# Patient Record
Sex: Female | Born: 1950 | Race: White | Hispanic: No | Marital: Married | Smoking: Current every day smoker
Health system: Southern US, Community
[De-identification: ages and names within clinical notes are randomized; demographics above are authoritative.]

## PROBLEM LIST (undated history)

## (undated) DIAGNOSIS — E785 Hyperlipidemia, unspecified: Secondary | ICD-10-CM

## (undated) HISTORY — DX: Hyperlipidemia, unspecified: E78.5

---
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
## 2012-11-26 ENCOUNTER — Encounter: Payer: Self-pay | Admitting: Physician Assistant

## 2012-11-26 ENCOUNTER — Ambulatory Visit (INDEPENDENT_AMBULATORY_CARE_PROVIDER_SITE_OTHER): Payer: PRIVATE HEALTH INSURANCE | Admitting: Physician Assistant

## 2012-11-26 ENCOUNTER — Other Ambulatory Visit: Payer: Self-pay | Admitting: Physician Assistant

## 2012-11-26 VITALS — BP 123/75 | HR 78 | Ht 61.0 in | Wt 142.0 lb

## 2012-11-26 DIAGNOSIS — E785 Hyperlipidemia, unspecified: Secondary | ICD-10-CM | POA: Insufficient documentation

## 2012-11-26 DIAGNOSIS — Z131 Encounter for screening for diabetes mellitus: Secondary | ICD-10-CM

## 2012-11-26 DIAGNOSIS — Z0000 Encounter for general adult medical examination without abnormal findings: Secondary | ICD-10-CM

## 2012-11-26 LAB — LIPID PANEL: Cholesterol: 198 mg/dL (ref 0–200)

## 2012-11-26 LAB — COMPLETE METABOLIC PANEL WITH GFR
Albumin: 4.8 g/dL (ref 3.5–5.2)
BUN: 13 mg/dL (ref 6–23)
CO2: 29 mEq/L (ref 19–32)
Calcium: 9.6 mg/dL (ref 8.4–10.5)
Chloride: 105 mEq/L (ref 96–112)
GFR, Est Non African American: 79 mL/min
Glucose, Bld: 98 mg/dL (ref 70–99)
Potassium: 4.1 mEq/L (ref 3.5–5.3)

## 2012-11-26 MED ORDER — ATORVASTATIN CALCIUM 10 MG PO TABS: 10.0000 mg | ORAL_TABLET | Freq: Every day | ORAL | Status: DC

## 2012-11-26 NOTE — Patient Instructions (Addendum)
Will call with lab results.  Continue with regular exercise.

## 2012-11-26 NOTE — Progress Notes (Addendum)
  Subjective:     Nashea Chumney is a 62 y.o. female and is here for a comprehensive physical exam. The patient reports no problems.  History   Social History  . Marital Status: Married    Spouse Name: N/A    Number of Children: N/A  . Years of Education: N/A   Occupational History  . Not on file.   Social History Main Topics  . Smoking status: Current Every Day Smoker  . Smokeless tobacco: Not on file  . Alcohol Use: 3.0 oz/week    5 Glasses of wine per week  . Drug Use: No  . Sexually Active: Yes   Other Topics Concern  . Not on file   Social History Narrative  . No narrative on file   Health Maintenance  Topic Date Due  . Influenza Vaccine  05/07/1952  . Pap Smear  07/06/1968  . Tetanus/tdap  07/06/1969  . Colonoscopy  07/06/2000  . Zostavax  07/06/2010  . Mammogram  04/05/2013    The following portions of the patient's history were reviewed and updated as appropriate: allergies, current medications, past family history, past medical history, past social history, past surgical history and problem list.  Review of Systems A comprehensive review of systems was negative.   Objective:    BP 123/75  Pulse 78  Ht 5\' 1"  (1.549 m)  Wt 142 lb (64.411 kg)  BMI 26.84 kg/m2 General appearance: alert, cooperative and appears stated age Head: Normocephalic, without obvious abnormality, atraumatic Eyes: conjunctivae/corneas clear. PERRL, EOM's intact. Fundi benign. Ears: normal TM's and external ear canals both ears Nose: Nares normal. Septum midline. Mucosa normal. No drainage or sinus tenderness. Throat: lips, mucosa, and tongue normal; teeth and gums normal Neck: no adenopathy, no carotid bruit, no JVD, supple, symmetrical, trachea midline and thyroid not enlarged, symmetric, no tenderness/mass/nodules Back: symmetric, no curvature. ROM normal. No CVA tenderness. Lungs: clear to auscultation bilaterally Heart: regular rate and rhythm, S1, S2 normal, no murmur, click,  rub or gallop Abdomen: soft, non-tender; bowel sounds normal; no masses,  no organomegaly no umbilicus from birth herniation and was filled in.  Extremities: extremities normal, atraumatic, no cyanosis or edema Pulses: 2+ and symmetric Skin: Skin color, texture, turgor normal. No rashes or lesions Lymph nodes: Cervical, supraclavicular, and axillary nodes normal. Neurologic: Grossly normal    Assessment:    Healthy female exam.      Plan:    CPE/hyperlipidemia- Will get fasting labs. Vaccines up to date. Per pt reports Tdap 3 years ago and shingles. Will wait for record confirmation. Pap smear up to date. Continue with daily exercise and healthy diet. Will order mammogram. Per pt last Bone density was 5 years ago and great. Consumes 4-5 servings of dairy a day.  See After Visit Summary for Counseling Recommendations

## 2012-11-28 ENCOUNTER — Encounter: Payer: Self-pay | Admitting: Physician Assistant

## 2012-11-28 ENCOUNTER — Telehealth: Payer: Self-pay | Admitting: *Deleted

## 2012-11-28 DIAGNOSIS — Z1239 Encounter for other screening for malignant neoplasm of breast: Secondary | ICD-10-CM

## 2012-11-28 NOTE — Telephone Encounter (Signed)
Mammogram ordered

## 2012-12-11 ENCOUNTER — Ambulatory Visit (INDEPENDENT_AMBULATORY_CARE_PROVIDER_SITE_OTHER): Payer: PRIVATE HEALTH INSURANCE

## 2012-12-11 DIAGNOSIS — Z1239 Encounter for other screening for malignant neoplasm of breast: Secondary | ICD-10-CM

## 2012-12-11 DIAGNOSIS — Z1231 Encounter for screening mammogram for malignant neoplasm of breast: Secondary | ICD-10-CM

## 2013-01-30 ENCOUNTER — Encounter: Payer: Self-pay | Admitting: Physician Assistant

## 2013-01-30 MED ORDER — ATORVASTATIN CALCIUM 10 MG PO TABS: 10.0000 mg | ORAL_TABLET | Freq: Every day | ORAL | Status: DC

## 2013-04-18 ENCOUNTER — Telehealth: Payer: Self-pay | Admitting: Family Medicine

## 2013-04-18 DIAGNOSIS — Z23 Encounter for immunization: Secondary | ICD-10-CM

## 2013-04-18 MED ORDER — ZOSTER VACCINE LIVE 19400 UNT/0.65ML ~~LOC~~ SOLR: 0.6500 mL | Freq: Once | SUBCUTANEOUS | Status: DC

## 2013-04-18 NOTE — Telephone Encounter (Signed)
Need for shingles vaccine

## 2013-07-11 ENCOUNTER — Other Ambulatory Visit: Payer: Self-pay

## 2013-11-27 ENCOUNTER — Ambulatory Visit (INDEPENDENT_AMBULATORY_CARE_PROVIDER_SITE_OTHER): Payer: BC Managed Care – PPO | Admitting: Physician Assistant

## 2013-11-27 ENCOUNTER — Other Ambulatory Visit: Payer: Self-pay | Admitting: Physician Assistant

## 2013-11-27 ENCOUNTER — Other Ambulatory Visit (HOSPITAL_COMMUNITY)
Admission: RE | Admit: 2013-11-27 | Discharge: 2013-11-27 | Disposition: A | Discharge status: Home / Self Care | Payer: BC Managed Care – PPO | Source: Ambulatory Visit | Attending: Family Medicine | Admitting: Family Medicine

## 2013-11-27 ENCOUNTER — Encounter: Payer: Self-pay | Admitting: Physician Assistant

## 2013-11-27 VITALS — BP 117/71 | HR 81 | Ht 61.0 in | Wt 139.0 lb

## 2013-11-27 DIAGNOSIS — Z1151 Encounter for screening for human papillomavirus (HPV): Secondary | ICD-10-CM | POA: Insufficient documentation

## 2013-11-27 DIAGNOSIS — Z131 Encounter for screening for diabetes mellitus: Secondary | ICD-10-CM

## 2013-11-27 DIAGNOSIS — F17200 Nicotine dependence, unspecified, uncomplicated: Secondary | ICD-10-CM

## 2013-11-27 DIAGNOSIS — Z01419 Encounter for gynecological examination (general) (routine) without abnormal findings: Secondary | ICD-10-CM

## 2013-11-27 DIAGNOSIS — Z0000 Encounter for general adult medical examination without abnormal findings: Secondary | ICD-10-CM

## 2013-11-27 DIAGNOSIS — Z1231 Encounter for screening mammogram for malignant neoplasm of breast: Secondary | ICD-10-CM

## 2013-11-27 DIAGNOSIS — Z780 Asymptomatic menopausal state: Secondary | ICD-10-CM

## 2013-11-27 DIAGNOSIS — E785 Hyperlipidemia, unspecified: Secondary | ICD-10-CM

## 2013-11-27 DIAGNOSIS — Z23 Encounter for immunization: Secondary | ICD-10-CM

## 2013-11-27 LAB — COMPLETE METABOLIC PANEL WITH GFR
ALBUMIN: 4.4 g/dL (ref 3.5–5.2)
ALT: 13 U/L (ref 0–35)
AST: 16 U/L (ref 0–37)
Alkaline Phosphatase: 49 U/L (ref 39–117)
BUN: 10 mg/dL (ref 6–23)
CALCIUM: 9.1 mg/dL (ref 8.4–10.5)
CO2: 28 meq/L (ref 19–32)
Chloride: 106 mEq/L (ref 96–112)
Creat: 0.68 mg/dL (ref 0.50–1.10)
GLUCOSE: 88 mg/dL (ref 70–99)
POTASSIUM: 4.2 meq/L (ref 3.5–5.3)
SODIUM: 141 meq/L (ref 135–145)
TOTAL PROTEIN: 6.5 g/dL (ref 6.0–8.3)
Total Bilirubin: 0.5 mg/dL (ref 0.2–1.2)

## 2013-11-27 LAB — LIPID PANEL
Cholesterol: 170 mg/dL (ref 0–200)
HDL: 59 mg/dL (ref >39)
LDL Cholesterol: 92 mg/dL (ref 0–99)
Total CHOL/HDL Ratio: 2.9 Ratio
Triglycerides: 95 mg/dL (ref <150)
VLDL: 19 mg/dL (ref 0–40)

## 2013-11-27 NOTE — Progress Notes (Signed)
  Subjective:     Leslie Oconnell is a 63 y.o. female and is here for a comprehensive physical exam. The patient reports no problems.  History   Social History  . Marital Status: Married    Spouse Name: N/A    Number of Children: N/A  . Years of Education: N/A   Occupational History  . Not on file.   Social History Main Topics  . Smoking status: Current Every Day Smoker  . Smokeless tobacco: Not on file  . Alcohol Use: 3.0 oz/week    5 Glasses of wine per week  . Drug Use: No  . Sexual Activity: Yes   Other Topics Concern  . Not on file   Social History Narrative  . No narrative on file   Health Maintenance  Topic Date Due  . Influenza Vaccine  05/30/2014 (Originally 04/05/2013)  . Colonoscopy  09/05/2014  . Mammogram  12/12/2014  . Pap Smear  11/27/2016  . Tetanus/tdap  11/28/2023  . Zostavax  Completed    The following portions of the patient's history were reviewed and updated as appropriate: allergies, current medications, past family history, past medical history, past social history, past surgical history and problem list.  Review of Systems A comprehensive review of systems was negative.   Objective:    BP 117/71  Pulse 81  Ht 5\' 1"  (1.549 m)  Wt 139 lb (63.05 kg)  BMI 26.28 kg/m2 General appearance: alert, cooperative and appears stated age Head: Normocephalic, without obvious abnormality, atraumatic Eyes: conjunctivae/corneas clear. PERRL, EOM's intact. Fundi benign. Ears: normal TM's and external ear canals both ears Nose: Nares normal. Septum midline. Mucosa normal. No drainage or sinus tenderness. Throat: lips, mucosa, and tongue normal; teeth and gums normal Neck: no adenopathy, no carotid bruit, no JVD, supple, symmetrical, trachea midline and thyroid not enlarged, symmetric, no tenderness/mass/nodules Back: symmetric, no curvature. ROM normal. No CVA tenderness. Lungs: clear to auscultation bilaterally Heart: regular rate and rhythm, S1, S2 normal,  no murmur, click, rub or gallop Abdomen: soft, non-tender; bowel sounds normal; no masses,  no organomegaly Pelvic: cervix normal in appearance, external genitalia normal, no adnexal masses or tenderness, no cervical motion tenderness, uterus normal size, shape, and consistency and vagina normal without discharge Extremities: extremities normal, atraumatic, no cyanosis or edema Pulses: 2+ and symmetric Skin: Skin color, texture, turgor normal. No rashes or lesions Lymph nodes: Cervical, supraclavicular, and axillary nodes normal. Neurologic: Grossly normal    Assessment:    Healthy female exam.      Plan:    CPE- Tdap given today. Pap done today. Will order bone density to have completed with mammogram. Last colonoscopy was 2006 supposed to follow up in 10 years. She will be due for colonoscopy next year. Depression screening was zero out of two. Discussed pneumonia vaccine. Patient would like to check with insurance company first. Smoking cessation counseling was given to patient today. She continues to smoke 5-6 cigarettes a day. She continues to walk at least 5 miles a day. She denies any problems with shortness of breath or wheezing.  Screening labs given for patient to get drawn today. Patient does take Lipitor daily as well as calcium and vitamin D. See After Visit Summary for Counseling Recommendations

## 2013-11-27 NOTE — Patient Instructions (Signed)

## 2013-11-29 ENCOUNTER — Other Ambulatory Visit: Payer: Self-pay | Admitting: Physician Assistant

## 2013-11-29 MED ORDER — ATORVASTATIN CALCIUM 10 MG PO TABS: 10.0000 mg | ORAL_TABLET | Freq: Every day | ORAL | Status: DC

## 2013-12-24 ENCOUNTER — Ambulatory Visit (INDEPENDENT_AMBULATORY_CARE_PROVIDER_SITE_OTHER): Payer: BC Managed Care – PPO

## 2013-12-24 ENCOUNTER — Ambulatory Visit: Payer: BC Managed Care – PPO

## 2013-12-24 DIAGNOSIS — M899 Disorder of bone, unspecified: Secondary | ICD-10-CM

## 2013-12-24 DIAGNOSIS — M949 Disorder of cartilage, unspecified: Secondary | ICD-10-CM

## 2013-12-24 DIAGNOSIS — Z1231 Encounter for screening mammogram for malignant neoplasm of breast: Secondary | ICD-10-CM

## 2013-12-25 ENCOUNTER — Encounter: Payer: Self-pay | Admitting: Physician Assistant

## 2013-12-25 DIAGNOSIS — M8580 Other specified disorders of bone density and structure, unspecified site: Secondary | ICD-10-CM | POA: Insufficient documentation

## 2015-04-01 ENCOUNTER — Other Ambulatory Visit: Payer: Self-pay | Admitting: Physician Assistant

## 2015-04-01 DIAGNOSIS — Z139 Encounter for screening, unspecified: Secondary | ICD-10-CM

## 2015-04-09 ENCOUNTER — Ambulatory Visit (INDEPENDENT_AMBULATORY_CARE_PROVIDER_SITE_OTHER): Payer: BLUE CROSS/BLUE SHIELD

## 2015-04-09 DIAGNOSIS — Z139 Encounter for screening, unspecified: Secondary | ICD-10-CM

## 2015-04-09 DIAGNOSIS — Z1231 Encounter for screening mammogram for malignant neoplasm of breast: Secondary | ICD-10-CM | POA: Diagnosis not present

## 2015-04-18 ENCOUNTER — Emergency Department (INDEPENDENT_AMBULATORY_CARE_PROVIDER_SITE_OTHER)
Admission: EM | Admit: 2015-04-18 | Discharge: 2015-04-18 | Disposition: A | Discharge status: Home / Self Care | Payer: BLUE CROSS/BLUE SHIELD | Source: Home / Self Care | Attending: Family Medicine | Admitting: Family Medicine

## 2015-04-18 ENCOUNTER — Encounter: Payer: Self-pay | Admitting: Emergency Medicine

## 2015-04-18 DIAGNOSIS — J019 Acute sinusitis, unspecified: Secondary | ICD-10-CM | POA: Diagnosis not present

## 2015-04-18 DIAGNOSIS — B9689 Other specified bacterial agents as the cause of diseases classified elsewhere: Secondary | ICD-10-CM

## 2015-04-18 LAB — POCT RAPID STREP A (OFFICE): Rapid Strep A Screen: NEGATIVE

## 2015-04-18 MED ORDER — SALINE SPRAY 0.65 % NA SOLN: 1.0000 | NASAL | Status: AC | PRN

## 2015-04-18 MED ORDER — BENZONATATE 100 MG PO CAPS: 100.0000 mg | ORAL_CAPSULE | Freq: Three times a day (TID) | ORAL | Status: DC

## 2015-04-18 MED ORDER — DOXYCYCLINE HYCLATE 100 MG PO CAPS: 100.0000 mg | ORAL_CAPSULE | Freq: Two times a day (BID) | ORAL | Status: DC

## 2015-04-18 NOTE — Discharge Instructions (Signed)
Please take your antibiotic, Doxycycline, as prescribed and be sure to complete entire course even if you start to feel better to ensure infection does not come back.   You may take 400-600mg  Ibuprofen (Motrin) every 6-8 hours for fever and pain  Alternate with Tylenol  You may take 500mg  Tylenol every 4-6 hours as needed for fever and pain  Follow-up with your primary care provider next week for recheck of symptoms if not improving.  Be sure to drink plenty of fluids and rest, at least 8hrs of sleep a night, preferably more while you are sick. Return urgent care or go to closest ER if you cannot keep down fluids/signs of dehydration, fever not reducing with Tylenol, difficulty breathing/wheezing, stiff neck, worsening condition, or other concerns (see below)

## 2015-04-18 NOTE — ED Provider Notes (Signed)
CSN: [id number]     Arrival date & time 04/18/15  7096 History   First MD Initiated Contact with Patient 04/18/15 [phone]     Chief Complaint  Patient presents with  . Sinus Problem  . Nasal Congestion  . Cough   (Consider location/radiation/quality/duration/timing/severity/associated sxs/prior Treatment) HPI The pt is a 64yo female presenting to [facility] with c/o gradually worsening cough with nasal congestion, sore throat, and bilateral ear pain and pressure. Pt states she was around her grandchildren recently who were diagnosed with strep throat. Pt states she has never had strep throat but would like to be checked as her symptoms are worsening. Pt's main complaint is her nasal congestion and bilateral ear "fullness" that is moderate in severity.  Cough is mild intermittent, non-productive. She has not tried any OTC medications as she states she has had a decongestant in the past and "couldn't sleep for a whole week."  She reports subjective fever with hot and cold chills but her home thermometer read 96 F.  Denies n/v/d. Denies tick bites or rashes. No recent travel but states she is traveling to [place] in about 3 weeks and wants to be better by then.  Annual Physical with her PCP, [place] Planas is scheduled for 05/06/15.  Past Medical History  Diagnosis Date  . Hyperlipidemia    Past Surgical History  Procedure Laterality Date  . Hernia repair  1952  . Tonsilectomy/adenoidectomy with myringotomy  1964   Family History  Problem Relation Age of Onset  . Cancer Mother    Social History  Substance Use Topics  . Smoking status: Current Every Day Smoker  . Smokeless tobacco: None  . Alcohol Use: 3.0 oz/week    5 Glasses of wine per week   OB History    No data available     Review of Systems  Constitutional: Positive for fever ( subjective) and chills.  HENT: Positive for congestion, ear pain ( bilateral) and sore throat. Negative for trouble swallowing and voice change.    Respiratory: Positive for cough and wheezing. Negative for shortness of breath.   Cardiovascular: Negative for chest pain and palpitations.  Gastrointestinal: Negative for nausea, vomiting, abdominal pain and diarrhea.  Musculoskeletal: Positive for myalgias and arthralgias. Negative for back pain.  Skin: Negative for rash.  All other systems reviewed and are negative.   Allergies  Review of patient's allergies indicates no known allergies.  Home Medications   Prior to Admission medications   Medication Sig Start Date End Date Taking? Authorizing Provider  aspirin 81 MG tablet Take 81 mg by mouth daily.    Historical Provider, MD  atorvastatin (LIPITOR) 10 MG tablet Take 1 tablet (10 mg total) by mouth daily. 11/29/13   Jade L Breeback, PA-C  benzonatate (TESSALON) 100 MG capsule Take 1 capsule (100 mg total) by mouth every 8 (eight) hours. 04/18/15   Noland Fordyce, PA-C  calcium carbonate (OS-CAL) 600 MG TABS Take 600 mg by mouth daily.    Historical Provider, MD  CINNAMON PO Take 2,000 mg by mouth daily.    Historical Provider, MD  doxycycline (VIBRAMYCIN) 100 MG capsule Take 1 capsule (100 mg total) by mouth 2 (two) times daily. One po bid x 7 days 04/18/15   Noland Fordyce, PA-C  fish oil-omega-3 fatty acids 1000 MG capsule Take 2 g by mouth daily.    Historical Provider, MD  Red Yeast Rice 600 MG CAPS Take 1,200 mg by mouth daily.    Historical Provider, MD  sodium chloride (OCEAN) 0.65 % SOLN nasal spray Place 1 spray into both nostrils as needed. 04/18/15   Noland Fordyce, PA-C  zoster vaccine live, PF, (ZOSTAVAX) 48546 UNT/0.65ML injection Inject 19,400 Units into the skin once. 04/18/13   Sean Hommel, DO   BP 133/73 mmHg  Pulse 86  Temp(Src) 98.6 F (37 C) (Oral)  Resp 16  Ht 5\' 1"  (1.549 m)  Wt 137 lb 12 oz (62.483 kg)  BMI 26.04 kg/m2  SpO2 100% Physical Exam  Constitutional: She appears well-developed and well-nourished. No distress.  HENT:  Head: Normocephalic and  atraumatic.  Right Ear: Hearing, tympanic membrane, external ear and ear canal normal.  Left Ear: Hearing, tympanic membrane, external ear and ear canal normal.  Nose: Mucosal edema present. Right sinus exhibits no maxillary sinus tenderness and no frontal sinus tenderness. Left sinus exhibits no maxillary sinus tenderness and no frontal sinus tenderness.  Mouth/Throat: Uvula is midline and mucous membranes are normal. Posterior oropharyngeal erythema present. No oropharyngeal exudate, posterior oropharyngeal edema or tonsillar abscesses.  Eyes: Conjunctivae are normal. No scleral icterus.  Neck: Normal range of motion. Neck supple.  Cardiovascular: Normal rate, regular rhythm and normal heart sounds.   Pulmonary/Chest: Effort normal and breath sounds normal. No respiratory distress. She has no wheezes. She has no rales. She exhibits no tenderness.  Abdominal: Soft. There is no tenderness.  Musculoskeletal: Normal range of motion.  Neurological: She is alert.  Skin: Skin is warm and dry. She is not diaphoretic.  Nursing note and vitals reviewed.   ED Course  Procedures (including critical care time) Labs Review Labs Reviewed  POCT RAPID STREP A (OFFICE)    Imaging Review No results found.   MDM   1. Acute bacterial rhinosinusitis     Pt c/o worsening nasal congestion, ear pain, sore throat and cough. Exam c/w rhinosinusitis.  Due to duration of symptoms w/o improvement, will start on antibiotics for bacterial cause of symptoms. Rx: doxycycline and nasal saline. Tessalon pearls if cough worsens. Advised she may also take acetaminophen and/or ibuprofen for fever and pain. Encouraged fluids and rest. F/u with PCP in 1 week if not improving. Encouraged to keep previously scheduled annual physical exam for 8/31 Patient verbalized understanding and agreement with treatment plan.     Noland Fordyce, PA-C 04/18/15 1052

## 2015-04-18 NOTE — ED Notes (Signed)
Patient presents to [facility] with C/O cough cold and congestion times one week with intermittent sore throat.

## 2015-05-06 ENCOUNTER — Ambulatory Visit (INDEPENDENT_AMBULATORY_CARE_PROVIDER_SITE_OTHER): Payer: BLUE CROSS/BLUE SHIELD | Admitting: Physician Assistant

## 2015-05-06 ENCOUNTER — Encounter: Payer: Self-pay | Admitting: Physician Assistant

## 2015-05-06 VITALS — BP 125/58 | HR 84 | Ht 61.0 in | Wt 136.0 lb

## 2015-05-06 DIAGNOSIS — Z131 Encounter for screening for diabetes mellitus: Secondary | ICD-10-CM | POA: Diagnosis not present

## 2015-05-06 DIAGNOSIS — E785 Hyperlipidemia, unspecified: Secondary | ICD-10-CM | POA: Diagnosis not present

## 2015-05-06 DIAGNOSIS — Z0000 Encounter for general adult medical examination without abnormal findings: Secondary | ICD-10-CM

## 2015-05-06 DIAGNOSIS — Z23 Encounter for immunization: Secondary | ICD-10-CM

## 2015-05-06 DIAGNOSIS — Z114 Encounter for screening for human immunodeficiency virus [HIV]: Secondary | ICD-10-CM

## 2015-05-06 DIAGNOSIS — Z1159 Encounter for screening for other viral diseases: Secondary | ICD-10-CM

## 2015-05-06 DIAGNOSIS — Z1211 Encounter for screening for malignant neoplasm of colon: Secondary | ICD-10-CM

## 2015-05-06 LAB — COMPLETE METABOLIC PANEL WITH GFR
ALBUMIN: 4.7 g/dL (ref 3.6–5.1)
ALK PHOS: 44 U/L (ref 33–130)
ALT: 12 U/L (ref 6–29)
AST: 15 U/L (ref 10–35)
BILIRUBIN TOTAL: 0.5 mg/dL (ref 0.2–1.2)
BUN: 12 mg/dL (ref 7–25)
CO2: 28 mmol/L (ref 20–31)
CREATININE: 0.81 mg/dL (ref 0.50–0.99)
Calcium: 9.6 mg/dL (ref 8.6–10.4)
Chloride: 105 mmol/L (ref 98–110)
GFR, EST AFRICAN AMERICAN: 89 mL/min (ref >=60)
GFR, Est Non African American: 77 mL/min (ref >=60)
Glucose, Bld: 88 mg/dL (ref 65–99)
Potassium: 4.4 mmol/L (ref 3.5–5.3)
Sodium: 143 mmol/L (ref 135–146)
TOTAL PROTEIN: 6.7 g/dL (ref 6.1–8.1)

## 2015-05-06 LAB — LIPID PANEL
Cholesterol: 184 mg/dL (ref 125–200)
HDL: 50 mg/dL (ref >=46)
LDL Cholesterol: 109 mg/dL (ref <130)
Total CHOL/HDL Ratio: 3.7 Ratio (ref <=5.0)
Triglycerides: 125 mg/dL (ref <150)
VLDL: 25 mg/dL (ref <30)

## 2015-05-06 NOTE — Patient Instructions (Signed)

## 2015-05-07 LAB — HIV ANTIBODY (ROUTINE TESTING W REFLEX): HIV 1&2 Ab, 4th Generation: NONREACTIVE

## 2015-05-07 LAB — HEPATITIS C ANTIBODY: HCV Ab: NEGATIVE

## 2015-05-08 NOTE — Progress Notes (Signed)
  Subjective:     Leslie Oconnell is a 64 y.o. female and is here for a comprehensive physical exam. The patient reports no problems.  Social History   Social History  . Marital Status: Married    Spouse Name: N/A  . Number of Children: N/A  . Years of Education: N/A   Occupational History  . Not on file.   Social History Main Topics  . Smoking status: Current Every Day Smoker  . Smokeless tobacco: Not on file  . Alcohol Use: 3.0 oz/week    5 Glasses of wine per week  . Drug Use: No  . Sexual Activity: Yes   Other Topics Concern  . Not on file   Social History Narrative   Health Maintenance  Topic Date Due  . COLONOSCOPY  09/05/2014  . INFLUENZA VACCINE  04/05/2016  . PAP SMEAR  11/27/2016  . MAMMOGRAM  04/08/2017  . TETANUS/TDAP  11/28/2023  . ZOSTAVAX  Completed  . Hepatitis C Screening  Completed  . HIV Screening  Completed    The following portions of the patient's history were reviewed and updated as appropriate: allergies, current medications, past family history, past medical history, past social history, past surgical history and problem list.  Review of Systems A comprehensive review of systems was negative.   Objective:    BP 125/58 mmHg  Pulse 84  Ht 5\' 1"  (1.549 m)  Wt 136 lb (61.689 kg)  BMI 25.71 kg/m2 General appearance: alert, cooperative and appears stated age Head: Normocephalic, without obvious abnormality, atraumatic Eyes: conjunctivae/corneas clear. PERRL, EOM's intact. Fundi benign. Ears: normal TM's and external ear canals both ears Nose: Nares normal. Septum midline. Mucosa normal. No drainage or sinus tenderness. Throat: lips, mucosa, and tongue normal; teeth and gums normal Neck: no adenopathy, no carotid bruit, no JVD, supple, symmetrical, trachea midline and thyroid not enlarged, symmetric, no tenderness/mass/nodules Back: symmetric, no curvature. ROM normal. No CVA tenderness. Lungs: clear to auscultation bilaterally Heart: regular  rate and rhythm, S1, S2 normal, no murmur, click, rub or gallop Abdomen: soft, non-tender; bowel sounds normal; no masses,  no organomegaly Pelvic: cervix normal in appearance, external genitalia normal, no adnexal masses or tenderness, no cervical motion tenderness, uterus normal size, shape, and consistency and vagina normal without discharge Extremities: extremities normal, atraumatic, no cyanosis or edema Pulses: 2+ and symmetric Skin: Skin color, texture, turgor normal. No rashes or lesions Lymph nodes: Cervical, supraclavicular, and axillary nodes normal. Neurologic: Grossly normal    Assessment:    Healthy female exam.      Plan:    CPE- flu shot given.  mammogram up to date, pap up to date, fasting labs given. Screening labs done. Discussed vitamin D 800 units and calcium 1500mg  daily or 4 servings daily. Regular exercise. Colonoscopy referral given.    See After Visit Summary for Counseling Recommendations

## 2015-05-19 ENCOUNTER — Other Ambulatory Visit: Payer: Self-pay | Admitting: Physician Assistant

## 2015-09-09 DIAGNOSIS — Z1211 Encounter for screening for malignant neoplasm of colon: Secondary | ICD-10-CM | POA: Diagnosis not present

## 2015-09-09 DIAGNOSIS — K5521 Angiodysplasia of colon with hemorrhage: Secondary | ICD-10-CM | POA: Diagnosis not present

## 2016-04-12 ENCOUNTER — Emergency Department (INDEPENDENT_AMBULATORY_CARE_PROVIDER_SITE_OTHER)
Admission: EM | Admit: 2016-04-12 | Discharge: 2016-04-12 | Disposition: A | Discharge status: Home / Self Care | Payer: Medicare Other | Source: Home / Self Care | Attending: Family Medicine | Admitting: Family Medicine

## 2016-04-12 ENCOUNTER — Encounter: Payer: Self-pay | Admitting: *Deleted

## 2016-04-12 DIAGNOSIS — W57XXXA Bitten or stung by nonvenomous insect and other nonvenomous arthropods, initial encounter: Secondary | ICD-10-CM

## 2016-04-12 DIAGNOSIS — L089 Local infection of the skin and subcutaneous tissue, unspecified: Secondary | ICD-10-CM | POA: Diagnosis not present

## 2016-04-12 DIAGNOSIS — T148 Other injury of unspecified body region: Secondary | ICD-10-CM

## 2016-04-12 MED ORDER — CEPHALEXIN 500 MG PO CAPS: 500.0000 mg | ORAL_CAPSULE | Freq: Three times a day (TID) | ORAL | 0 refills | Status: AC

## 2016-04-12 NOTE — Discharge Instructions (Signed)
°  Please take antibiotics as prescribed and be sure to complete entire course even if you start to feel better to ensure infection does not come back.  You may also take benadryl to help with swelling and itching, or you may try a non-drowsy antihistamine such as Claritin or Zyrtec (generic form is fine)

## 2016-04-12 NOTE — ED Triage Notes (Signed)
Pt c/o LT hand redness and swelling x 2 days post bee sting. Denies fever.

## 2016-04-12 NOTE — ED Provider Notes (Signed)
CSN: [id number]     Arrival date & time 04/12/16  0813 History   First MD Initiated Contact with Patient 04/12/16 0825     Chief Complaint  Patient presents with  . Insect Bite   (Consider location/radiation/quality/duration/timing/severity/associated sxs/prior Treatment) HPI Leslie Oconnell is a 65 y.o. female presenting to UC with c/o gradually worsening Left hand swelling and redness that started yesterday after she was stung by a black insect pt believes may have been a hornet.  She has not tried anything besides ice for her symptoms. She is concerned the redness is now progressing into her wrist.  She reports minimal itching and soreness. Denies oral swelling or difficulty breathing. Denies rashes elsewhere.   Past Medical History:  Diagnosis Date  . Hyperlipidemia    Past Surgical History:  Procedure Laterality Date  . [place]  . TONSILECTOMY/ADENOIDECTOMY WITH MYRINGOTOMY  1964   Family History  Problem Relation Age of Onset  . Cancer Mother    Social History  Substance Use Topics  . Smoking status: Current Every Day Smoker    Packs/day: 0.50    Types: Cigarettes  . Smokeless tobacco: Never Used  . Alcohol use 3.0 oz/week    5 Glasses of wine per week   OB History    No data available     Review of Systems  Constitutional: Negative for chills and fever.  Respiratory: Negative for shortness of breath, wheezing and stridor.   Musculoskeletal: Positive for joint swelling. Negative for arthralgias and myalgias.  Skin: Positive for color change and rash. Negative for pallor and wound.    Allergies  Review of patient's allergies indicates no known allergies.  Home Medications   Prior to Admission medications   Medication Sig Start Date End Date Taking? Authorizing Provider  aspirin 81 MG tablet Take 81 mg by mouth daily.    Historical Provider, MD  atorvastatin (LIPITOR) 10 MG tablet TAKE 1 TABLET (10 MG TOTAL) BY MOUTH DAILY. 05/20/15   Jade L Breeback,  PA-C  calcium carbonate (OS-CAL) 600 MG TABS Take 600 mg by mouth daily.    Historical Provider, MD  cephALEXin (KEFLEX) 500 MG capsule Take 1 capsule (500 mg total) by mouth 3 (three) times daily. 04/12/16 04/19/16  Noland Fordyce, PA-C  CINNAMON PO Take 2,000 mg by mouth daily.    Historical Provider, MD  fish oil-omega-3 fatty acids 1000 MG capsule Take 2 g by mouth daily.    Historical Provider, MD  Red Yeast Rice 600 MG CAPS Take 1,200 mg by mouth daily.    Historical Provider, MD  sodium chloride (OCEAN) 0.65 % SOLN nasal spray Place 1 spray into both nostrils as needed. 04/18/15   Noland Fordyce, PA-C  zoster vaccine live, PF, (ZOSTAVAX) 16109 UNT/0.65ML injection Inject 19,400 Units into the skin once. 04/18/13   Marcial Pacas, DO   Meds Ordered and Administered this Visit  Medications - No data to display  BP 155/88 (BP Location: Left Arm)   Pulse 77   Temp 97.7 F (36.5 C) (Oral)   Resp 16   Ht 5\' 1"  (1.549 m)   Wt 137 lb (62.1 kg)   SpO2 97%   BMI 25.89 kg/m  No data found.   Physical Exam  Constitutional: She is oriented to person, place, and time. She appears well-developed and well-nourished.  HENT:  Head: Normocephalic and atraumatic.  No oral swelling  Cardiovascular: Normal rate.   Pulmonary/Chest: Effort normal.  Musculoskeletal: Normal range of motion.  She exhibits edema. She exhibits no tenderness.       Hands: Left hand: full ROM, mild to moderate edema. Non-tender.  Neurological: She is alert and oriented to person, place, and time.  Skin: Skin is warm and dry. Capillary refill takes less than 2 seconds. There is erythema.  Left hand: skin in tact. Diffuse erythema with mild warmth. No bleeding or discharge. Erythema extends from proximal phalanxes to dorsal wrist.   Psychiatric: She has a normal mood and affect. Her behavior is normal.  Nursing note and vitals reviewed.   Urgent Care Course   Clinical Course    Procedures (including critical care  time)  Labs Review Labs Reviewed - No data to display  Imaging Review No results found.    MDM   1. Infected insect bite    Pt c/o worsening redness, warm and swelling of Left hand after possible hornet sting yesterday.  Due to warmth of skin and progression of redness up wrist, will cover for underlying infection.  Rx: Keflex  Encouraged to take OTC antihistamine such as Benadryl at night and non-drowsy antihistamine such as Claritin or Zyrtec during the day. F/u in 3-4 days if not improving, sooner if worsening. Patient verbalized understanding and agreement with treatment plan.     Noland Fordyce, PA-C 04/12/16 [phone]

## 2016-04-14 ENCOUNTER — Telehealth: Payer: Self-pay | Admitting: *Deleted

## 2016-04-14 NOTE — Telephone Encounter (Signed)
Spoke to pt she reports that her hand is doing much better. She reports some itching still without benadryl, but most of the swelling has resolved. Advised her to call back if she has any questions or concerns.

## 2016-06-09 ENCOUNTER — Other Ambulatory Visit: Payer: Self-pay | Admitting: Physician Assistant

## 2016-06-09 DIAGNOSIS — Z1239 Encounter for other screening for malignant neoplasm of breast: Secondary | ICD-10-CM

## 2016-06-15 ENCOUNTER — Ambulatory Visit (INDEPENDENT_AMBULATORY_CARE_PROVIDER_SITE_OTHER): Payer: Medicare Other

## 2016-06-15 DIAGNOSIS — Z1239 Encounter for other screening for malignant neoplasm of breast: Secondary | ICD-10-CM

## 2016-06-15 DIAGNOSIS — Z1231 Encounter for screening mammogram for malignant neoplasm of breast: Secondary | ICD-10-CM | POA: Diagnosis not present

## 2016-06-20 ENCOUNTER — Emergency Department (INDEPENDENT_AMBULATORY_CARE_PROVIDER_SITE_OTHER)
Admission: EM | Admit: 2016-06-20 | Discharge: 2016-06-20 | Disposition: A | Discharge status: Home / Self Care | Payer: Medicare Other | Source: Home / Self Care | Attending: Family Medicine | Admitting: Family Medicine

## 2016-06-20 ENCOUNTER — Encounter: Payer: Self-pay | Admitting: Emergency Medicine

## 2016-06-20 DIAGNOSIS — H6692 Otitis media, unspecified, left ear: Secondary | ICD-10-CM

## 2016-06-20 DIAGNOSIS — J069 Acute upper respiratory infection, unspecified: Secondary | ICD-10-CM

## 2016-06-20 MED ORDER — BENZONATATE 100 MG PO CAPS: 100.0000 mg | ORAL_CAPSULE | Freq: Three times a day (TID) | ORAL | 0 refills | Status: DC

## 2016-06-20 MED ORDER — AMOXICILLIN-POT CLAVULANATE 875-125 MG PO TABS: 1.0000 | ORAL_TABLET | Freq: Two times a day (BID) | ORAL | 0 refills | Status: DC

## 2016-06-20 NOTE — Discharge Instructions (Signed)
°  You may take 400-600mg  Ibuprofen (Motrin) every 6-8 hours for fever and pain  Alternate with Tylenol  You may take 500mg  Tylenol every 4-6 hours as needed for fever and pain  You may also try over the counter sinus rinses to help keep your ears and sinuses from clogging up as well as help keep your sinuses moist to help prevent pain and nose bleeds. Follow-up with your primary care provider next week for recheck of symptoms if not improving.  Be sure to drink plenty of fluids and rest, at least 8hrs of sleep a night, preferably more while you are sick. Return urgent care or go to closest ER if you cannot keep down fluids/signs of dehydration, fever not reducing with Tylenol, difficulty breathing/wheezing, stiff neck, worsening condition, or other concerns (see below)  Please take antibiotics as prescribed and be sure to complete entire course even if you start to feel better to ensure infection does not come back.

## 2016-06-20 NOTE — ED Provider Notes (Signed)
CSN: [id number]     Arrival date & time 06/20/16  [id number] History   First MD Initiated Contact with Patient 06/20/16 0831     Chief Complaint  Patient presents with  . Cough   (Consider location/radiation/quality/duration/timing/severity/associated sxs/prior Treatment) HPI Leslie Oconnell is a 65 y.o. female presenting to UC with c/o 1 week of waxing and waning sinus congestion, bilateral ear pain, and sinus pressure.  She has been trying OTC medications with minimal temporary relief.  Hx of sinus infections in the past. She notes she is better during the day but when she lays down at night she develops a productive cough that keeps her up.  Denies chest pain or SOB. No hx of asthma or COPD.   Past Medical History:  Diagnosis Date  . Hyperlipidemia    Past Surgical History:  Procedure Laterality Date  . [place]  . TONSILECTOMY/ADENOIDECTOMY WITH MYRINGOTOMY  1964   Family History  Problem Relation Age of Onset  . Cancer Mother    Social History  Substance Use Topics  . Smoking status: Current Every Day Smoker    Packs/day: 0.50    Years: 50.00    Types: Cigarettes  . Smokeless tobacco: Never Used  . Alcohol use 3.0 oz/week    5 Glasses of wine per week   OB History    No data available     Review of Systems  Constitutional: Negative for chills and fever.  HENT: Positive for congestion, ear pain, postnasal drip, sinus pressure and sore throat. Negative for trouble swallowing and voice change.   Respiratory: Positive for cough. Negative for shortness of breath.   Cardiovascular: Negative for chest pain and palpitations.  Gastrointestinal: Positive for nausea. Negative for abdominal pain, diarrhea and vomiting.  Musculoskeletal: Negative for arthralgias, back pain and myalgias.  Skin: Negative for rash.  Neurological: Positive for headaches. Negative for dizziness and light-headedness.    Allergies  Review of patient's allergies indicates no known  allergies.  Home Medications   Prior to Admission medications   Medication Sig Start Date End Date Taking? Authorizing Provider  amoxicillin-clavulanate (AUGMENTIN) 875-125 MG tablet Take 1 tablet by mouth 2 (two) times daily. One po bid x 7 days 06/20/16   Noland Fordyce, PA-C  aspirin 81 MG tablet Take 81 mg by mouth daily.    Historical Provider, MD  atorvastatin (LIPITOR) 10 MG tablet TAKE 1 TABLET (10 MG TOTAL) BY MOUTH DAILY. 05/20/15   Jade L Breeback, PA-C  benzonatate (TESSALON) 100 MG capsule Take 1-2 capsules (100-200 mg total) by mouth every 8 (eight) hours. 06/20/16   Noland Fordyce, PA-C  calcium carbonate (OS-CAL) 600 MG TABS Take 600 mg by mouth daily.    Historical Provider, MD  CINNAMON PO Take 2,000 mg by mouth daily.    Historical Provider, MD  fish oil-omega-3 fatty acids 1000 MG capsule Take 2 g by mouth daily.    Historical Provider, MD  Red Yeast Rice 600 MG CAPS Take 1,200 mg by mouth daily.    Historical Provider, MD  sodium chloride (OCEAN) 0.65 % SOLN nasal spray Place 1 spray into both nostrils as needed. 04/18/15   Noland Fordyce, PA-C  zoster vaccine live, PF, (ZOSTAVAX) 60454 UNT/0.65ML injection Inject 19,400 Units into the skin once. 04/18/13   Marcial Pacas, DO   Meds Ordered and Administered this Visit  Medications - No data to display  BP 138/82 (BP Location: Left Arm)   Pulse 79   Temp 97.9 F (  36.6 C) (Oral)   Ht 5\' 1"  (1.549 m)   Wt 135 lb (61.2 kg)   SpO2 97%   BMI 25.51 kg/m  No data found.   Physical Exam  Constitutional: She appears well-developed and well-nourished. No distress.  HENT:  Head: Normocephalic and atraumatic.  Right Ear: Tympanic membrane normal.  Left Ear: Tympanic membrane is erythematous and bulging.  Nose: Mucosal edema present. Right sinus exhibits no maxillary sinus tenderness and no frontal sinus tenderness. Left sinus exhibits maxillary sinus tenderness. Left sinus exhibits no frontal sinus tenderness.  Mouth/Throat:  Uvula is midline and mucous membranes are normal. Posterior oropharyngeal erythema present. No oropharyngeal exudate, posterior oropharyngeal edema or tonsillar abscesses.  Eyes: Conjunctivae are normal. No scleral icterus.  Neck: Normal range of motion. Neck supple.  Cardiovascular: Normal rate, regular rhythm and normal heart sounds.   Pulmonary/Chest: Effort normal and breath sounds normal. No stridor. No respiratory distress. She has no wheezes. She has no rales.  Abdominal: Soft. She exhibits no distension. There is no tenderness.  Musculoskeletal: Normal range of motion.  Lymphadenopathy:    She has no cervical adenopathy.  Neurological: She is alert.  Skin: Skin is warm and dry. She is not diaphoretic.  Nursing note and vitals reviewed.   Urgent Care Course   Clinical Course    Procedures (including critical care time)  Labs Review Labs Reviewed - No data to display  Imaging Review No results found.    MDM   1. Left acute otitis media   2. Upper respiratory tract infection, unspecified type    Pt c/o waxing and waning URI symptoms for about 1 week.  Exam c/w Left AOM secondary to URI. Lungs: CTAB, no wheeze or rhonchi.  Rx: Augmentin and tessalon Encouraged sinus rinses. Advised pt to use acetaminophen and ibuprofen as needed for fever and pain. Encouraged rest and fluids. F/u with PCP in 7-10 days if not improving, sooner if worsening. Pt verbalized understanding and agreement with tx plan.     Noland Fordyce, PA-C 06/20/16 [phone]

## 2016-06-20 NOTE — ED Triage Notes (Signed)
Productive cough, ear pressure, matted eyes in the morning x 1 week

## 2016-07-04 ENCOUNTER — Ambulatory Visit (INDEPENDENT_AMBULATORY_CARE_PROVIDER_SITE_OTHER): Payer: Medicare Other | Admitting: Physician Assistant

## 2016-07-04 ENCOUNTER — Encounter: Payer: Self-pay | Admitting: Physician Assistant

## 2016-07-04 VITALS — BP 127/69 | HR 87 | Ht 61.0 in | Wt 136.0 lb

## 2016-07-04 DIAGNOSIS — Z131 Encounter for screening for diabetes mellitus: Secondary | ICD-10-CM

## 2016-07-04 DIAGNOSIS — E7800 Pure hypercholesterolemia, unspecified: Secondary | ICD-10-CM

## 2016-07-04 DIAGNOSIS — Z0000 Encounter for general adult medical examination without abnormal findings: Secondary | ICD-10-CM | POA: Diagnosis not present

## 2016-07-04 DIAGNOSIS — Z23 Encounter for immunization: Secondary | ICD-10-CM | POA: Diagnosis not present

## 2016-07-04 LAB — COMPLETE METABOLIC PANEL WITH GFR
ALBUMIN: 4.5 g/dL (ref 3.6–5.1)
ALK PHOS: 50 U/L (ref 33–130)
ALT: 10 U/L (ref 6–29)
AST: 15 U/L (ref 10–35)
BILIRUBIN TOTAL: 0.5 mg/dL (ref 0.2–1.2)
BUN: 12 mg/dL (ref 7–25)
CALCIUM: 9.6 mg/dL (ref 8.6–10.4)
CO2: 27 mmol/L (ref 20–31)
CREATININE: 0.76 mg/dL (ref 0.50–0.99)
Chloride: 105 mmol/L (ref 98–110)
GFR, Est African American: >89 mL/min (ref >=60)
GFR, Est Non African American: 83 mL/min (ref >=60)
Glucose, Bld: 95 mg/dL (ref 65–99)
Potassium: 5.1 mmol/L (ref 3.5–5.3)
Sodium: 141 mmol/L (ref 135–146)
Total Protein: 7.1 g/dL (ref 6.1–8.1)

## 2016-07-04 LAB — LIPID PANEL
CHOLESTEROL: 201 mg/dL — AB (ref 125–200)
HDL: 54 mg/dL (ref >=46)
LDL Cholesterol: 117 mg/dL (ref <130)
Total CHOL/HDL Ratio: 3.7 Ratio (ref <=5.0)
Triglycerides: 149 mg/dL (ref <150)
VLDL: 30 mg/dL (ref <30)

## 2016-07-04 MED ORDER — FLUCONAZOLE 150 MG PO TABS: 150.0000 mg | ORAL_TABLET | Freq: Once | ORAL | 0 refills | Status: AC

## 2016-07-04 NOTE — Patient Instructions (Signed)
flonase 2 sprays each nostril for 2-3 weeks mucinex 600mg  twice for 2-3 weeks.   Keeping You Healthy  Get These Tests  Blood Pressure- Have your blood pressure checked by your healthcare provider at least once a year.  Normal blood pressure is 120/80.  Weight- Have your body mass index (BMI) calculated to screen for obesity.  BMI is a measure of body fat based on height and weight.  You can calculate your own BMI at [employer]  Cholesterol- Have your cholesterol checked every year.  Diabetes- Have your blood sugar checked every year if you have high blood pressure, high cholesterol, a family history of diabetes or if you are overweight.  Pap Test - Have a pap test every 1 to 5 years if you have been sexually active.  If you are older than 65 and recent pap tests have been normal you may not need additional pap tests.  In addition, if you have had a hysterectomy  for benign disease additional pap tests are not necessary.  Mammogram-Yearly mammograms are essential for early detection of breast cancer  Screening for Colon Cancer- Colonoscopy starting at age 58. Screening may begin sooner depending on your family history and other health conditions.  Follow up colonoscopy as directed by your Gastroenterologist.  Screening for Osteoporosis- Screening begins at age 35 with bone density scanning, sooner if you are at higher risk for developing Osteoporosis.  Get these medicines  Calcium with Vitamin D- Your body requires 1200-1500 mg of Calcium a day and [phone] IU of Vitamin D a day.  You can only absorb 500 mg of Calcium at a time therefore Calcium must be taken in 2 or 3 separate doses throughout the day.  Hormones- Hormone therapy has been associated with increased risk for certain cancers and heart disease.  Talk to your healthcare provider about if you need relief from menopausal symptoms.  Aspirin- Ask your healthcare provider about taking Aspirin to prevent Heart Disease  and Stroke.  Get these Immuniztions  Flu shot- Every fall  Pneumonia shot- Once after the age of 37; if you are younger ask your healthcare provider if you need a pneumonia shot.  Tetanus- Every ten years.  Zostavax- Once after the age of 59 to prevent shingles.  Take these steps  Don't smoke- Your healthcare provider can help you quit. For tips on how to quit, ask your healthcare provider or go to www.smokefree.gov or call 1-800 QUIT-NOW.  Be physically active- Exercise 5 days a week for a minimum of 30 minutes.  If you are not already physically active, start slow and gradually work up to 30 minutes of moderate physical activity.  Try walking, dancing, bike riding, swimming, etc.  Eat a healthy diet- Eat a variety of healthy foods such as fruits, vegetables, whole grains, low fat milk, low fat cheeses, yogurt, lean meats, chicken, fish, eggs, dried beans, tofu, etc.  For more information go to www.thenutritionsource.org  Dental visit- Brush and floss teeth twice daily; visit your dentist twice a year.  Eye exam- Visit your Optometrist or Ophthalmologist yearly.  Drink alcohol in moderation- Limit alcohol intake to one drink or less a day.  Never drink and drive.  Depression- Your emotional health is as important as your physical health.  If you're feeling down or losing interest in things you normally enjoy, please talk to your healthcare provider.  Seat Belts- can save your life; always wear one  Smoke/Carbon Monoxide detectors- These detectors need to be installed on the  appropriate level of your home.  Replace batteries at least once a year.  Violence- If anyone is threatening or hurting you, please tell your healthcare provider.  Living Will/ Health care power of attorney- Discuss with your healthcare provider and family.

## 2016-07-04 NOTE — Progress Notes (Signed)
Subjective:    Leslie Oconnell is a 65 y.o. female who presents for a welcome to Medicare exam.   Cardiac risk factors: advanced age (older than 51 for men, 53 for women) and smoking/ tobacco exposure.  Activities of Daily Living  In your present state of health, do you have any difficulty performing the following activities?:  Preparing food and eating?: No Bathing yourself: No Getting dressed: No Using the toilet:No Moving around from place to place: No In the past year have you fallen or had a near fall?:No  Current exercise habits: Home exercise routine includes walking 1 hrs per day.   Dietary issues discussed: none   Depression Screen (Note: if answer to either of the following is "Yes", then a more complete depression screening is indicated)  Q1: Over the past two weeks, have you felt down, depressed or hopeless?no Q2: Over the past two weeks, have you felt little interest or pleasure in doing things? no   The following portions of the patient's history were reviewed and updated as appropriate: allergies, current medications, past family history, past medical history, past social history, past surgical history and problem list. Review of Systems A comprehensive review of systems was negative.    Objective:     Vision by Snellen chart: right [id number] declines measurement, left [id number] declines measurement Blood pressure 127/69, pulse 87, height 5\' 1"  (1.549 m), weight 136 lb (61.7 kg). Body mass index is 25.7 kg/m. BP 127/69   Pulse 87   Ht 5\' 1"  (1.549 m)   Wt 136 lb (61.7 kg)   BMI 25.70 kg/m   General Appearance:    Alert, cooperative, no distress, appears stated age  Head:    Normocephalic, without obvious abnormality, atraumatic  Eyes:    PERRL, conjunctiva/corneas clear, EOM's intact, fundi    benign, both eyes  Ears:    Normal TM's and external ear canals, both ears  Nose:   Nares normal, septum midline, mucosa normal, no drainage    or sinus tenderness   Throat:   Lips, mucosa, and tongue normal; teeth and gums normal  Neck:   Supple, symmetrical, trachea midline, no adenopathy;    thyroid:  no enlargement/tenderness/nodules; no carotid   bruit or JVD  Back:     Symmetric, no curvature, ROM normal, no CVA tenderness  Lungs:     Clear to auscultation bilaterally, respirations unlabored  Chest Wall:    No tenderness or deformity   Heart:    Regular rate and rhythm, S1 and S2 normal, no murmur, rub   or gallop     Abdomen:     Soft, non-tender, bowel sounds active all four quadrants,    no masses, no organomegaly        Extremities:   Extremities normal, atraumatic, no cyanosis or edema  Pulses:   2+ and symmetric all extremities  Skin:   Skin color, texture, turgor normal, no rashes or lesions  Lymph nodes:   Cervical, supraclavicular, and axillary nodes normal  Neurologic:   CNII-XII intact, normal strength, sensation and reflexes    throughout       Assessment:      Healthy woman exam   Plan:      During the course of the visit the patient was educated and counseled about appropriate screening and preventive services including: Pneumococcal vaccine   Influenza vaccine  Screening electrocardiogram  Smoking cessation counseling  6 CIT was 2, normal.   Flu and prevnar 13 given today.  Labs ordered Lipid and cmp. Ok to refill lipitor after reviewing labs but does not want sent to pharmacy. Will call when she needs medication.   EKG baseline- NSR, non specific ST abnormality. Pt is asymptomatic.  Patient Instructions (the written plan) was given to the patient.

## 2016-09-01 ENCOUNTER — Other Ambulatory Visit: Payer: Self-pay | Admitting: Physician Assistant

## 2016-09-01 ENCOUNTER — Encounter: Payer: Self-pay | Admitting: Physician Assistant

## 2016-09-01 ENCOUNTER — Other Ambulatory Visit: Payer: Self-pay

## 2016-09-01 MED ORDER — ATORVASTATIN CALCIUM 10 MG PO TABS: ORAL_TABLET | ORAL | 2 refills | Status: DC

## 2017-02-21 ENCOUNTER — Telehealth: Payer: Self-pay | Admitting: Family Medicine

## 2017-02-21 MED ORDER — ZOSTER VAC RECOMB ADJUVANTED 50 MCG/0.5ML IM SUSR: 0.5000 mL | Freq: Once | INTRAMUSCULAR | 1 refills | Status: AC

## 2017-02-21 NOTE — Telephone Encounter (Signed)
Pt requests rx for shingrix

## 2017-06-12 ENCOUNTER — Other Ambulatory Visit: Payer: Self-pay | Admitting: Physician Assistant

## 2017-06-12 DIAGNOSIS — Z1231 Encounter for screening mammogram for malignant neoplasm of breast: Secondary | ICD-10-CM

## 2017-06-30 ENCOUNTER — Ambulatory Visit (INDEPENDENT_AMBULATORY_CARE_PROVIDER_SITE_OTHER): Payer: Medicare Other

## 2017-06-30 DIAGNOSIS — Z1231 Encounter for screening mammogram for malignant neoplasm of breast: Secondary | ICD-10-CM

## 2017-06-30 NOTE — Progress Notes (Signed)
Call pt: screening mammogram normal follow up in 1 year.

## 2017-07-12 ENCOUNTER — Ambulatory Visit (INDEPENDENT_AMBULATORY_CARE_PROVIDER_SITE_OTHER): Payer: Medicare Other | Admitting: Physician Assistant

## 2017-07-12 ENCOUNTER — Encounter: Payer: Self-pay | Admitting: Physician Assistant

## 2017-07-12 VITALS — BP 139/64 | HR 93 | Ht 61.0 in | Wt 132.0 lb

## 2017-07-12 DIAGNOSIS — E7800 Pure hypercholesterolemia, unspecified: Secondary | ICD-10-CM

## 2017-07-12 DIAGNOSIS — J3489 Other specified disorders of nose and nasal sinuses: Secondary | ICD-10-CM | POA: Diagnosis not present

## 2017-07-12 DIAGNOSIS — Z0000 Encounter for general adult medical examination without abnormal findings: Secondary | ICD-10-CM

## 2017-07-12 DIAGNOSIS — F17200 Nicotine dependence, unspecified, uncomplicated: Secondary | ICD-10-CM

## 2017-07-12 DIAGNOSIS — Z23 Encounter for immunization: Secondary | ICD-10-CM | POA: Diagnosis not present

## 2017-07-12 LAB — LIPID PANEL W/REFLEX DIRECT LDL
CHOL/HDL RATIO: 3.2 (calc) (ref <5.0)
Cholesterol: 211 mg/dL — ABNORMAL HIGH (ref <200)
HDL: 66 mg/dL (ref >50)
LDL Cholesterol (Calc): 120 mg/dL (calc) — ABNORMAL HIGH
NON-HDL CHOLESTEROL (CALC): 145 mg/dL — AB (ref <130)
TRIGLYCERIDES: 135 mg/dL (ref <150)

## 2017-07-12 LAB — COMPLETE METABOLIC PANEL WITH GFR
AG RATIO: 1.8 (calc) (ref 1.0–2.5)
ALT: 13 U/L (ref 6–29)
AST: 16 U/L (ref 10–35)
Albumin: 4.5 g/dL (ref 3.6–5.1)
Alkaline phosphatase (APISO): 50 U/L (ref 33–130)
BILIRUBIN TOTAL: 0.5 mg/dL (ref 0.2–1.2)
BUN: 11 mg/dL (ref 7–25)
CHLORIDE: 104 mmol/L (ref 98–110)
CO2: 30 mmol/L (ref 20–32)
Calcium: 9.6 mg/dL (ref 8.6–10.4)
Creat: 0.87 mg/dL (ref 0.50–0.99)
GFR, EST AFRICAN AMERICAN: 80 mL/min/{1.73_m2} (ref >=60)
GFR, Est Non African American: 69 mL/min/{1.73_m2} (ref >=60)
GLOBULIN: 2.5 g/dL (ref 1.9–3.7)
Glucose, Bld: 89 mg/dL (ref 65–99)
POTASSIUM: 4.1 mmol/L (ref 3.5–5.3)
SODIUM: 141 mmol/L (ref 135–146)
TOTAL PROTEIN: 7 g/dL (ref 6.1–8.1)

## 2017-07-12 NOTE — Progress Notes (Signed)
Subjective:     Leslie Oconnell is a 66 y.o. female and is here for a comprehensive physical exam. The patient reports problems - Patient is having some sinus drainage down the back of her throat.  She has noticed that for the last week or so.  She denies any  fever, chills, shortness of breath.  She does have a dry cough and some sinus pressure.  Her sinus drainage is clear.  Throat hurts on and off.  She has not tried anything to make better..  Social History   Socioeconomic History  . Marital status: Married    Spouse name: Not on file  . Number of children: Not on file  . Years of education: Not on file  . Highest education level: Not on file  Social Needs  . Financial resource strain: Not on file  . Food insecurity - worry: Not on file  . Food insecurity - inability: Not on file  . Transportation needs - medical: Not on file  . Transportation needs - non-medical: Not on file  Occupational History  . Not on file  Tobacco Use  . Smoking status: Current Every Day Smoker    Packs/day: 0.50    Years: 50.00    Pack years: 25.00    Types: Cigarettes  . Smokeless tobacco: Never Used  Substance and Sexual Activity  . Alcohol use: Yes    Alcohol/week: 3.0 oz    Types: 5 Glasses of wine per week  . Drug use: No  . Sexual activity: Yes  Other Topics Concern  . Not on file  Social History Narrative  . Not on file   Health Maintenance  Topic Date Due  . COLONOSCOPY  09/05/2014  . MAMMOGRAM  07/01/2019  . TETANUS/TDAP  11/28/2023  . INFLUENZA VACCINE  Completed  . DEXA SCAN  Completed  . Hepatitis C Screening  Completed  . PNA vac Low Risk Adult  Completed    The following portions of the patient's history were reviewed and updated as appropriate: allergies, current medications, past family history, past medical history, past social history, past surgical history and problem list.  Review of Systems Pertinent items noted in HPI and remainder of comprehensive ROS otherwise  negative.   Objective:    BP 139/64   Pulse 93   Ht 5\' 1"  (1.549 m)   Wt 132 lb (59.9 kg)   BMI 24.94 kg/m  General appearance: alert, cooperative and appears stated age Head: Normocephalic, without obvious abnormality, atraumatic Eyes: conjunctivae/corneas clear. PERRL, EOM's intact. Fundi benign. Ears: normal TM's and external ear canals both ears Nose: Nares normal. septum midline. clear PND in oropharynx. no sinus tenderness.  Throat: lips, mucosa, and tongue normal; teeth and gums normal Neck: no adenopathy, no carotid bruit, no JVD, supple, symmetrical, trachea midline and thyroid not enlarged, symmetric, no tenderness/mass/nodules Back: symmetric, no curvature. ROM normal. No CVA tenderness. Lungs: clear to auscultation bilaterally Heart: regular rate and rhythm, S1, S2 normal, no murmur, click, rub or gallop Abdomen: soft, non-tender; bowel sounds normal; no masses,  no organomegaly Extremities: extremities normal, atraumatic, no cyanosis or edema Pulses: 2+ and symmetric Skin: Skin color, texture, turgor normal. No rashes or lesions Lymph nodes: Cervical, supraclavicular, and axillary nodes normal. Neurologic: Alert and oriented X 3, normal strength and tone. Normal symmetric reflexes. Normal coordination and gait    Assessment:    Healthy female exam.      Plan:    Leslie KitchenMarland KitchenAndrew was seen today for annual exam.  Diagnoses and all orders for this visit:  Routine physical examination -     Lipid Panel w/reflex Direct LDL -     COMPLETE METABOLIC PANEL WITH GFR  Influenza vaccine needed -     Flu Vaccine QUAD 6+ mos PF IM (Fluarix Quad PF)  Pure hypercholesterolemia -     Lipid Panel w/reflex Direct LDL  Sinus drainage  Need for pneumococcal vaccination -     Pneumococcal polysaccharide vaccine 23-valent greater than or equal to 2yo subcutaneous/IM  Current smoker   .Leslie Oconnell Depression screen PHQ 2/9 07/12/2017  Decreased Interest 0  Down, Depressed, Hopeless 0   PHQ - 2 Score 0   .. Discussed 150 minutes of exercise a week.  Encouraged vitamin D 1000 units and Calcium 1300mg  or 4 servings of dairy a day.   Mammogram up to date.  Colonoscopy is up to date. We found in system and will get abstracted in.  Pneumonia vaccine given today.  Pt has zostavax. Discussed shingrix. HO given. Encouraged due to medicare to get at local pharmacy.   Discussed smoking cessation. She is not interested. Discussed low dose CT screening and she is not interested.   discussed symptomatic care of sinus drainage with tylenol cold sinus severe as needed, consider humidifer in room at night, suck on cough drops during the day. Follow up with any sinus tenderness, change in color, fever, worsening of symptoms.   See After Visit Summary for Counseling Recommendations

## 2017-07-12 NOTE — Patient Instructions (Signed)

## 2017-07-12 NOTE — Progress Notes (Deleted)
   Subjective:    Patient ID: Leslie Oconnell, female    DOB: 1950/11/02, 67 y.o.   MRN: [id number]  HPI 23  2015  3 months ago sinus looked good.   Smoking- 10 to none. Not interested in [place]. 40 plus.  Bone density next year.   Wait until next year.   Sinus draining few.   Review of Systems     Objective:   Physical Exam        Assessment & Plan:

## 2017-07-19 ENCOUNTER — Encounter: Payer: Self-pay | Admitting: Physician Assistant

## 2018-01-01 ENCOUNTER — Encounter: Payer: Self-pay | Admitting: Physician Assistant

## 2018-01-01 MED ORDER — ATORVASTATIN CALCIUM 10 MG PO TABS: ORAL_TABLET | ORAL | 2 refills | Status: DC

## 2018-04-06 ENCOUNTER — Other Ambulatory Visit: Payer: Self-pay

## 2018-06-26 ENCOUNTER — Encounter: Payer: Self-pay | Admitting: Physician Assistant

## 2018-06-26 DIAGNOSIS — M8580 Other specified disorders of bone density and structure, unspecified site: Secondary | ICD-10-CM

## 2018-06-26 DIAGNOSIS — F17200 Nicotine dependence, unspecified, uncomplicated: Secondary | ICD-10-CM

## 2018-06-26 DIAGNOSIS — Z13820 Encounter for screening for osteoporosis: Secondary | ICD-10-CM

## 2018-06-26 NOTE — Telephone Encounter (Signed)
Order placed

## 2018-06-26 NOTE — Telephone Encounter (Signed)
Ok to send order for bone density to breast clinic.

## 2018-06-27 ENCOUNTER — Other Ambulatory Visit: Payer: Self-pay | Admitting: Physician Assistant

## 2018-06-27 DIAGNOSIS — Z1231 Encounter for screening mammogram for malignant neoplasm of breast: Secondary | ICD-10-CM

## 2018-07-18 ENCOUNTER — Ambulatory Visit (INDEPENDENT_AMBULATORY_CARE_PROVIDER_SITE_OTHER): Payer: Medicare Other

## 2018-07-18 DIAGNOSIS — Z1231 Encounter for screening mammogram for malignant neoplasm of breast: Secondary | ICD-10-CM | POA: Diagnosis not present

## 2018-07-18 DIAGNOSIS — F17200 Nicotine dependence, unspecified, uncomplicated: Secondary | ICD-10-CM

## 2018-07-18 DIAGNOSIS — M8580 Other specified disorders of bone density and structure, unspecified site: Secondary | ICD-10-CM

## 2018-07-18 DIAGNOSIS — E2839 Other primary ovarian failure: Secondary | ICD-10-CM | POA: Diagnosis not present

## 2018-07-18 DIAGNOSIS — Z13820 Encounter for screening for osteoporosis: Secondary | ICD-10-CM

## 2018-07-18 NOTE — Progress Notes (Signed)
Call pt: normal mammogram. Follow up in 1 year.

## 2018-07-18 NOTE — Progress Notes (Signed)
Subjective:   Leslie Oconnell is a 67 y.o. female who presents for an Initial Medicare Annual Wellness Visit.  Review of Systems    No ROS.  Medicare Wellness Visit. Additional risk factors are reflected in the social history.   Cardiac Risk Factors include: none;smoking/ tobacco exposure Sleep patterns: Gets 7-8 hours a night sleep. Wakes up 1 time to go the bathroom.  Home Safety/Smoke Alarms: Feels safe in home. Smoke alarms in place.  Living environment;  Lives with husband in 1 1/2 story home. Handrails in place. Shower is a Consulting civil engineer and no grab rails in place.    Female:   Pap- aged out unless necessary      Mammo- ordered       Dexa scan-  ordered      CCS-utd     Objective:    Today's Vitals   07/24/18 0914  BP: 120/71  Pulse: 83  Temp: 97.6 F (36.4 C)  TempSrc: Oral  SpO2: 99%  Weight: 133 lb (60.3 kg)  Height: 5\' 1"  (1.549 m)   Body mass index is 25.13 kg/m.  Advanced Directives 07/24/2018  Does Patient Have a Medical Advance Directive? No  Would patient like information on creating a medical advance directive? No - Patient declined    Current Medications (verified) Outpatient Encounter Medications as of 07/24/2018  Medication Sig  . aspirin 81 MG tablet Take 81 mg by mouth daily.  [name] atorvastatin (LIPITOR) 10 MG tablet TAKE 1 TABLET (10 MG TOTAL) BY MOUTH DAILY.  . calcium carbonate (OS-CAL) 600 MG TABS Take 600 mg by mouth daily.  [name] CINNAMON PO Take 2,000 mg by mouth daily.  . fish oil-omega-3 fatty acids 1000 MG capsule Take 2 g by mouth daily.  . Red Yeast Rice 600 MG CAPS Take 1,200 mg by mouth daily.  . sodium chloride (OCEAN) 0.65 % SOLN nasal spray Place 1 spray into both nostrils as needed.   No facility-administered encounter medications on file as of 07/24/2018.     Allergies (verified) Patient has no known allergies.   History: Past Medical History:  Diagnosis Date  . Hyperlipidemia    Past Surgical History:  Procedure  Laterality Date  . [place]  . TONSILECTOMY/ADENOIDECTOMY WITH MYRINGOTOMY  1964   Family History  Problem Relation Age of Onset  . Cancer Mother    Social History   Socioeconomic History  . Marital status: Married    Spouse name: Kyung Rudd  . Number of children: 1  . Years of education: Masters  . Highest education level: Master's degree (e.g., MA, MS, MEng, MEd, MSW, MBA)  Occupational History  . Occupation: retired    Comment: high Education officer, museum  Social Needs  . Financial resource strain: Not hard at all  . Food insecurity:    Worry: Never true    Inability: Never true  . Transportation needs:    Medical: No    Non-medical: No  Tobacco Use  . Smoking status: Current Every Day Smoker    Packs/day: 0.50    Years: 50.00    Pack years: 25.00    Types: Cigarettes  . Smokeless tobacco: Never Used  Substance and Sexual Activity  . Alcohol use: Yes    Alcohol/week: 5.0 standard drinks    Types: 5 Glasses of wine per week  . Drug use: No  . Sexual activity: Yes  Lifestyle  . Physical activity:    Days per week: 5 days    Minutes  per session: 30 min  . Stress: Not at all  Relationships  . Social connections:    Talks on phone: Three times a week    Gets together: Once a week    Attends religious service: More than 4 times per year    Active member of club or organization: No    Attends meetings of clubs or organizations: Never    Relationship status: Married  Other Topics Concern  . Not on file  Social History Narrative   Walks daily. Cleans house. Plays games to stimulate brain    Tobacco Counseling Ready to quit: Not Answered Counseling given: Not Answered   Clinical Intake:  Pre-visit preparation completed: Yes  Pain : No/denies pain     Nutritional Risks: None Diabetes: No  How often do you need to have someone help you when you read instructions, pamphlets, or other written materials from your doctor or pharmacy?: 1 - Never What is  the last grade level you completed in school?: Masters degree  Interpreter Needed?: No  Information entered by :: Orlie Dakin, LPN   Activities of Daily Living In your present state of health, do you have any difficulty performing the following activities: 07/24/2018  Hearing? Y  Comment patient is noticing some difficulty hearing, contributes to sinus drainage  Vision? N  Difficulty concentrating or making decisions? N  Walking or climbing stairs? N  Dressing or bathing? N  Doing errands, shopping? N  Preparing Food and eating ? N  Using the Toilet? N  In the past six months, have you accidently leaked urine? N  Do you have problems with loss of bowel control? N  Managing your Medications? N  Managing your Finances? N  Housekeeping or managing your Housekeeping? N  Some recent data might be hidden     Immunizations and Health Maintenance Immunization History  Administered Date(s) Administered  . Influenza,inj,Quad PF,6+ Mos 05/06/2015, 07/04/2016, 07/12/2017  . Pneumococcal Conjugate-13 07/04/2016  . Pneumococcal Polysaccharide-23 07/12/2017  . Tdap 11/27/2013  . Zoster 04/18/2013   Health Maintenance Due  Topic Date Due  . INFLUENZA VACCINE  04/05/2018    Patient Care Team: Lavada Mesi as PCP - General (Family Medicine)  Indicate any recent Medical Services you may have received from other than [facility] providers in the past year (date may be approximate).     Assessment:   This is a routine wellness examination for [place].Physical assessment deferred to PCP.   Hearing/Vision screen  Visual Acuity Screening   Right eye Left eye Both eyes  Without correction: 20/20 20/25 20/20   With correction:     Hearing Screening Comments: Whisper test done and patient reported back all 3 words correctly  Dietary issues and exercise activities discussed: Current Exercise Habits: Home exercise routine, Type of exercise: walking, Time (Minutes): 30, Frequency  (Times/Week): 5, Weekly Exercise (Minutes/Week): 150, Intensity: Moderate, Exercise limited by: None identified Diet  Healthy diet- eats fruits and veggies,  Yogurt daily and cheese Breakfast: Yogurt and granola bar Lunch: crackers and cheese Dinner: soups or salad      Goals   None    Depression Screen PHQ 2/9 Scores 07/24/2018 07/12/2017  PHQ - 2 Score 0 0    Fall Risk Fall Risk  07/24/2018 04/06/2018  Falls in the past year? 0 No  Comment - Emmi Telephone Survey: data to providers prior to load  Follow up Falls prevention discussed -    Is the patient's home free of loose throw rugs  in walkways, pet beds, electrical cords, etc?   yes      Grab bars in the bathroom? no      Handrails on the stairs?   yes      Adequate lighting?   yes   Cognitive Function:     6CIT Screen 07/24/2018 07/04/2016  What Year? 0 points 0 points  What month? 0 points 0 points  What time? 0 points 0 points  Count back from 20 0 points 0 points  Months in reverse 0 points 0 points  Repeat phrase 0 points 2 points  Total Score 0 2    Screening Tests Health Maintenance  Topic Date Due  . INFLUENZA VACCINE  04/05/2018  . MAMMOGRAM  07/18/2020  . TETANUS/TDAP  11/28/2023  . COLONOSCOPY  09/08/2025  . DEXA SCAN  Completed  . Hepatitis C Screening  Completed  . PNA vac Low Risk Adult  Completed       Plan:    Please schedule your next medicare wellness visit with me in 1 yr.  Ms. Braaksma , Thank you for taking time to come for your Medicare Wellness Visit. I appreciate your ongoing commitment to your health goals. Please review the following plan we discussed and let me know if I can assist you in the future.  Continue to drink plenty of water daily.  These are the goals we discussed: Goals   None     This is a list of the screening recommended for you and due dates:  Health Maintenance  Topic Date Due  . Flu Shot  04/05/2018  . Mammogram  07/18/2020  . Tetanus Vaccine  11/28/2023   . Colon Cancer Screening  09/08/2025  . DEXA scan (bone density measurement)  Completed  .  Hepatitis C: One time screening is recommended by Center for Disease Control  (CDC) for  adults born from [birth year] through 1966.   Completed  . Pneumonia vaccines  Completed     I have personally reviewed and noted the following in the patient's chart:   . Medical and social history . Use of alcohol, tobacco or illicit drugs  . Current medications and supplements . Functional ability and status . Nutritional status . Physical activity . Advanced directives . List of other physicians . Hospitalizations, surgeries, and ER visits in previous 12 months . Vitals . Screenings to include cognitive, depression, and falls . Referrals and appointments  In addition, I have reviewed and discussed with patient certain preventive protocols, quality metrics, and best practice recommendations. A written personalized care plan for preventive services as well as general preventive health recommendations were provided to patient.     Joanne Chars, LPN   16/06/[id number]

## 2018-07-18 NOTE — Progress Notes (Signed)
Call pt: bone density was normal. Continue vitamin D 1000 units and calcium 4 servings daily/1300mg .

## 2018-07-24 ENCOUNTER — Encounter: Payer: Self-pay | Admitting: Physician Assistant

## 2018-07-24 ENCOUNTER — Ambulatory Visit (INDEPENDENT_AMBULATORY_CARE_PROVIDER_SITE_OTHER): Payer: Medicare Other | Admitting: Physician Assistant

## 2018-07-24 ENCOUNTER — Ambulatory Visit (INDEPENDENT_AMBULATORY_CARE_PROVIDER_SITE_OTHER): Payer: Medicare Other | Admitting: *Deleted

## 2018-07-24 VITALS — BP 120/71 | HR 83 | Temp 97.6°F | Ht 61.0 in | Wt 133.0 lb

## 2018-07-24 VITALS — BP 120/71 | HR 83 | Ht 61.0 in | Wt 133.0 lb

## 2018-07-24 DIAGNOSIS — Z131 Encounter for screening for diabetes mellitus: Secondary | ICD-10-CM | POA: Diagnosis not present

## 2018-07-24 DIAGNOSIS — L989 Disorder of the skin and subcutaneous tissue, unspecified: Secondary | ICD-10-CM | POA: Diagnosis not present

## 2018-07-24 DIAGNOSIS — C44722 Squamous cell carcinoma of skin of right lower limb, including hip: Secondary | ICD-10-CM | POA: Diagnosis not present

## 2018-07-24 DIAGNOSIS — C44729 Squamous cell carcinoma of skin of left lower limb, including hip: Secondary | ICD-10-CM

## 2018-07-24 DIAGNOSIS — Z0000 Encounter for general adult medical examination without abnormal findings: Secondary | ICD-10-CM

## 2018-07-24 DIAGNOSIS — Z23 Encounter for immunization: Secondary | ICD-10-CM | POA: Diagnosis not present

## 2018-07-24 DIAGNOSIS — E782 Mixed hyperlipidemia: Secondary | ICD-10-CM | POA: Diagnosis not present

## 2018-07-24 NOTE — Patient Instructions (Addendum)
Please schedule your next medicare wellness visit with me in 1 yr.  Leslie Oconnell , Thank you for taking time to come for your Medicare Wellness Visit. I appreciate your ongoing commitment to your health goals. Please review the following plan we discussed and let me know if I can assist you in the future.  Health Maintenance for Postmenopausal Women Menopause is a normal process in which your reproductive ability comes to an end. This process happens gradually over a span of months to years, usually between the ages of 6 and 70. Menopause is complete when you have missed 12 consecutive menstrual periods. It is important to talk with your health care provider about some of the most common conditions that affect postmenopausal women, such as heart disease, cancer, and bone loss (osteoporosis). Adopting a healthy lifestyle and getting preventive care can help to promote your health and wellness. Those actions can also lower your chances of developing some of these common conditions. What should I know about menopause? During menopause, you may experience a number of symptoms, such as:  Moderate-to-severe hot flashes.  Night sweats.  Decrease in sex drive.  Mood swings.  Headaches.  Tiredness.  Irritability.  Memory problems.  Insomnia.  Choosing to treat or not to treat menopausal changes is an individual decision that you make with your health care provider. What should I know about hormone replacement therapy and supplements? Hormone therapy products are effective for treating symptoms that are associated with menopause, such as hot flashes and night sweats. Hormone replacement carries certain risks, especially as you become older. If you are thinking about using estrogen or estrogen with progestin treatments, discuss the benefits and risks with your health care provider. What should I know about heart disease and stroke? Heart disease, heart attack, and stroke become more likely as you  age. This may be due, in part, to the hormonal changes that your body experiences during menopause. These can affect how your body processes dietary fats, triglycerides, and cholesterol. Heart attack and stroke are both medical emergencies. There are many things that you can do to help prevent heart disease and stroke:  Have your blood pressure checked at least every 1-2 years. High blood pressure causes heart disease and increases the risk of stroke.  If you are 45-51 years old, ask your health care provider if you should take aspirin to prevent a heart attack or a stroke.  Do not use any tobacco products, including cigarettes, chewing tobacco, or electronic cigarettes. If you need help quitting, ask your health care provider.  It is important to eat a healthy diet and maintain a healthy weight. ? Be sure to include plenty of vegetables, fruits, low-fat dairy products, and lean protein. ? Avoid eating foods that are high in solid fats, added sugars, or salt (sodium).  Get regular exercise. This is one of the most important things that you can do for your health. ? Try to exercise for at least 150 minutes each week. The type of exercise that you do should increase your heart rate and make you sweat. This is known as moderate-intensity exercise. ? Try to do strengthening exercises at least twice each week. Do these in addition to the moderate-intensity exercise.  Know your numbers.Ask your health care provider to check your cholesterol and your blood glucose. Continue to have your blood tested as directed by your health care provider.  What should I know about cancer screening? There are several types of cancer. Take the following steps  to reduce your risk and to catch any cancer development as early as possible. Breast Cancer  Practice breast self-awareness. ? This means understanding how your breasts normally appear and feel. ? It also means doing regular breast self-exams. Let your health  care provider know about any changes, no matter how small.  If you are 2 or older, have a clinician do a breast exam (clinical breast exam or CBE) every year. Depending on your age, family history, and medical history, it may be recommended that you also have a yearly breast X-ray (mammogram).  If you have a family history of breast cancer, talk with your health care provider about genetic screening.  If you are at high risk for breast cancer, talk with your health care provider about having an MRI and a mammogram every year.  Breast cancer (BRCA) gene test is recommended for women who have family members with BRCA-related cancers. Results of the assessment will determine the need for genetic counseling and BRCA1 and for BRCA2 testing. BRCA-related cancers include these types: ? Breast. This occurs in males or females. ? Ovarian. ? Tubal. This may also be called fallopian tube cancer. ? Cancer of the abdominal or pelvic lining (peritoneal cancer). ? Prostate. ? Pancreatic.  Cervical, Uterine, and Ovarian Cancer Your health care provider may recommend that you be screened regularly for cancer of the pelvic organs. These include your ovaries, uterus, and vagina. This screening involves a pelvic exam, which includes checking for microscopic changes to the surface of your cervix (Pap test).  For women ages 21-65, health care providers may recommend a pelvic exam and a Pap test every three years. For women ages 36-65, they may recommend the Pap test and pelvic exam, combined with testing for human papilloma virus (HPV), every five years. Some types of HPV increase your risk of cervical cancer. Testing for HPV may also be done on women of any age who have unclear Pap test results.  Other health care providers may not recommend any screening for nonpregnant women who are considered low risk for pelvic cancer and have no symptoms. Ask your health care provider if a screening pelvic exam is right for  you.  If you have had past treatment for cervical cancer or a condition that could lead to cancer, you need Pap tests and screening for cancer for at least 20 years after your treatment. If Pap tests have been discontinued for you, your risk factors (such as having a new sexual partner) need to be reassessed to determine if you should start having screenings again. Some women have medical problems that increase the chance of getting cervical cancer. In these cases, your health care provider may recommend that you have screening and Pap tests more often.  If you have a family history of uterine cancer or ovarian cancer, talk with your health care provider about genetic screening.  If you have vaginal bleeding after reaching menopause, tell your health care provider.  There are currently no reliable tests available to screen for ovarian cancer.  Lung Cancer Lung cancer screening is recommended for adults 44-61 years old who are at high risk for lung cancer because of a history of smoking. A yearly low-dose CT scan of the lungs is recommended if you:  Currently smoke.  Have a history of at least 30 pack-years of smoking and you currently smoke or have quit within the past 15 years. A pack-year is smoking an average of one pack of cigarettes per day for one  year.  Yearly screening should:  Continue until it has been 15 years since you quit.  Stop if you develop a health problem that would prevent you from having lung cancer treatment.  Colorectal Cancer  This type of cancer can be detected and can often be prevented.  Routine colorectal cancer screening usually begins at age 2 and continues through age 3.  If you have risk factors for colon cancer, your health care provider may recommend that you be screened at an earlier age.  If you have a family history of colorectal cancer, talk with your health care provider about genetic screening.  Your health care provider may also recommend  using home test kits to check for hidden blood in your stool.  A small camera at the end of a tube can be used to examine your colon directly (sigmoidoscopy or colonoscopy). This is done to check for the earliest forms of colorectal cancer.  Direct examination of the colon should be repeated every 5-10 years until age 36. However, if early forms of precancerous polyps or small growths are found or if you have a family history or genetic risk for colorectal cancer, you may need to be screened more often.  Skin Cancer  Check your skin from head to toe regularly.  Monitor any moles. Be sure to tell your health care provider: ? About any new moles or changes in moles, especially if there is a change in a mole's shape or color. ? If you have a mole that is larger than the size of a pencil eraser.  If any of your family members has a history of skin cancer, especially at a young age, talk with your health care provider about genetic screening.  Always use sunscreen. Apply sunscreen liberally and repeatedly throughout the day.  Whenever you are outside, protect yourself by wearing long sleeves, pants, a wide-brimmed hat, and sunglasses.  What should I know about osteoporosis? Osteoporosis is a condition in which bone destruction happens more quickly than new bone creation. After menopause, you may be at an increased risk for osteoporosis. To help prevent osteoporosis or the bone fractures that can happen because of osteoporosis, the following is recommended:  If you are 69-103 years old, get at least 1,000 mg of calcium and at least 600 mg of vitamin D per day.  If you are older than age 42 but younger than age 35, get at least 1,200 mg of calcium and at least 600 mg of vitamin D per day.  If you are older than age 85, get at least 1,200 mg of calcium and at least 800 mg of vitamin D per day.  Smoking and excessive alcohol intake increase the risk of osteoporosis. Eat foods that are rich in  calcium and vitamin D, and do weight-bearing exercises several times each week as directed by your health care provider. What should I know about how menopause affects my mental health? Depression may occur at any age, but it is more common as you become older. Common symptoms of depression include:  Low or sad mood.  Changes in sleep patterns.  Changes in appetite or eating patterns.  Feeling an overall lack of motivation or enjoyment of activities that you previously enjoyed.  Frequent crying spells.  Talk with your health care provider if you think that you are experiencing depression. What should I know about immunizations? It is important that you get and maintain your immunizations. These include:  Tetanus, diphtheria, and pertussis (Tdap) booster vaccine.  Influenza every year before the flu season begins.  Pneumonia vaccine.  Shingles vaccine.  Your health care provider may also recommend other immunizations. This information is not intended to replace advice given to you by your health care provider. Make sure you discuss any questions you have with your health care provider. Document Released: 10/14/2005 Document Revised: 03/11/2016 Document Reviewed: 05/26/2015 Elsevier Interactive Patient Education  2018 [place] to drink plenty of water daily.

## 2018-07-25 ENCOUNTER — Encounter: Payer: Self-pay | Admitting: Physician Assistant

## 2018-07-25 LAB — COMPLETE METABOLIC PANEL WITH GFR
AG Ratio: 1.8 (calc) (ref 1.0–2.5)
ALT: 10 U/L (ref 6–29)
AST: 16 U/L (ref 10–35)
Albumin: 4.4 g/dL (ref 3.6–5.1)
Alkaline phosphatase (APISO): 49 U/L (ref 33–130)
BUN: 13 mg/dL (ref 7–25)
CALCIUM: 9.7 mg/dL (ref 8.6–10.4)
CO2: 24 mmol/L (ref 20–32)
CREATININE: 0.87 mg/dL (ref 0.50–0.99)
Chloride: 104 mmol/L (ref 98–110)
GFR, EST NON AFRICAN AMERICAN: 68 mL/min/{1.73_m2} (ref >=60)
GFR, Est African American: 79 mL/min/{1.73_m2} (ref >=60)
GLUCOSE: 93 mg/dL (ref 65–99)
Globulin: 2.4 g/dL (calc) (ref 1.9–3.7)
POTASSIUM: 4.8 mmol/L (ref 3.5–5.3)
Sodium: 141 mmol/L (ref 135–146)
Total Bilirubin: 0.5 mg/dL (ref 0.2–1.2)
Total Protein: 6.8 g/dL (ref 6.1–8.1)

## 2018-07-25 LAB — LIPID PANEL W/REFLEX DIRECT LDL
CHOL/HDL RATIO: 2.7 (calc) (ref <5.0)
Cholesterol: 171 mg/dL (ref <200)
HDL: 64 mg/dL (ref >50)
LDL Cholesterol (Calc): 88 mg/dL (calc)
NON-HDL CHOLESTEROL (CALC): 107 mg/dL (ref <130)
Triglycerides: 93 mg/dL (ref <150)

## 2018-07-25 MED ORDER — ATORVASTATIN CALCIUM 10 MG PO TABS
ORAL_TABLET | ORAL | 3 refills | Status: DC
Start: 2018-07-25 — End: 2019-09-20

## 2018-07-25 NOTE — Progress Notes (Signed)
   Subjective:    Patient ID: Leslie Oconnell, female    DOB: 1950-10-02, 67 y.o.   MRN: [id number]  HPI Pt is a 67 yo female who was being seen today for medicare wellness. Nurse noted a lesion on her left medial lower leg. Pt states it started out as a bump and she mashed it. Some white "stuff" came out but it has never healed and continue to get bigger and bigger. No hx of skin cancer. No fever, chills, body aches, nausea.   She would also like refills of her lipitor.   .. Active Ambulatory Problems    Diagnosis Date Noted  . Hyperlipidemia 11/26/2012  . Current smoker 11/27/2013  . Osteopenia 12/25/2013   Resolved Ambulatory Problems    Diagnosis Date Noted  . No Resolved Ambulatory Problems   No Additional Past Medical History      Review of Systems See HPI.     Objective:   Physical Exam  Constitutional: She is oriented to person, place, and time. She appears well-developed and well-nourished.  HENT:  Head: Normocephalic and atraumatic.  Cardiovascular: Normal rate and regular rhythm.  Pulmonary/Chest: Effort normal.  Neurological: She is alert and oriented to person, place, and time.  Skin:  Left lower medial leg 2cm by 2cm firm and raised papule with erythematous base largely covered by hyperkeratotic tissue. No active drainage. No warmth.           Assessment & Plan:  Marland KitchenMarland KitchenDiagnoses and all orders for this visit:  Preventative health care -     Lipid Panel w/reflex Direct LDL -     COMPLETE METABOLIC PANEL WITH GFR  Skin lesion -     Dermatology pathology  Mixed hyperlipidemia -     Lipid Panel w/reflex Direct LDL  Screening for diabetes mellitus -     COMPLETE METABOLIC PANEL WITH GFR  Other orders -     atorvastatin (LIPITOR) 10 MG tablet; TAKE 1 TABLET (10 MG TOTAL) BY MOUTH DAILY.  Shave Biopsy Procedure Note  Pre-operative Diagnosis: Suspicious lesion  Post-operative Diagnosis: same  Locations:left medial leg  Indications: not healing/not  resolving/enlarging  Anesthesia: Lidocaine 1% with epinephrine without added sodium bicarbonate  Procedure Details  History of allergy to iodine: no  Patient informed of the risks (including bleeding and infection) and benefits of the  procedure and Verbal informed consent obtained.  The lesion and surrounding area were given a sterile prep using chlorhexidine and draped in the usual sterile fashion. A scalpel was used to shave an area of skin approximately 2cm by 2cm.  Hemostasis achieved with alumuninum chloride, surgicell hemostatic dressing and silver sulfadine. Antibiotic ointment and a sterile dressing applied.  The specimen was sent for pathologic examination. The patient tolerated the procedure well.  EBL: scant   Condition: Stable  Complications: none.  Plan: 1. Instructed to keep the wound dry and covered for 24-48h and clean thereafter. 2. Warning signs of infection were reviewed.   3. Recommended that the patient use OTC acetaminophen as needed for pain.   Discussed with patient irritated dermatofibroma vs some type of squamous cell cancer.   Will checks labs today and refill medication as labs direct.

## 2018-07-25 NOTE — Progress Notes (Signed)
Call pt: cholesterol looks fantastic. Kidney, liver, glucose looks good.

## 2018-07-26 NOTE — Progress Notes (Signed)
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 It is something we need to take care of but very promising prognosis usually. We will make referral to derm to take more tissue out and make sure we get all the margins.

## 2018-07-26 NOTE — Addendum Note (Signed)
Addended by: Donella Stade on: 07/26/2018 11:09 PM   Modules accepted: Orders

## 2018-07-30 ENCOUNTER — Ambulatory Visit: Payer: Medicare Other

## 2018-07-30 ENCOUNTER — Encounter: Payer: Self-pay | Admitting: Physician Assistant

## 2018-07-31 DIAGNOSIS — C801 Malignant (primary) neoplasm, unspecified: Secondary | ICD-10-CM | POA: Diagnosis not present

## 2018-08-20 DIAGNOSIS — C44729 Squamous cell carcinoma of skin of left lower limb, including hip: Secondary | ICD-10-CM | POA: Diagnosis not present

## 2018-08-23 ENCOUNTER — Ambulatory Visit: Payer: Medicare Other

## 2018-11-20 DIAGNOSIS — C44729 Squamous cell carcinoma of skin of left lower limb, including hip: Secondary | ICD-10-CM | POA: Diagnosis not present

## 2018-11-23 DIAGNOSIS — D485 Neoplasm of uncertain behavior of skin: Secondary | ICD-10-CM | POA: Diagnosis not present

## 2019-01-23 DIAGNOSIS — D229 Melanocytic nevi, unspecified: Secondary | ICD-10-CM | POA: Diagnosis not present

## 2019-01-23 DIAGNOSIS — D1801 Hemangioma of skin and subcutaneous tissue: Secondary | ICD-10-CM | POA: Diagnosis not present

## 2019-01-23 DIAGNOSIS — L821 Other seborrheic keratosis: Secondary | ICD-10-CM | POA: Diagnosis not present

## 2019-01-23 DIAGNOSIS — L570 Actinic keratosis: Secondary | ICD-10-CM | POA: Diagnosis not present

## 2019-02-04 DIAGNOSIS — Z1159 Encounter for screening for other viral diseases: Secondary | ICD-10-CM | POA: Diagnosis not present

## 2019-05-01 DIAGNOSIS — L570 Actinic keratosis: Secondary | ICD-10-CM | POA: Diagnosis not present

## 2019-05-01 DIAGNOSIS — Z85828 Personal history of other malignant neoplasm of skin: Secondary | ICD-10-CM | POA: Diagnosis not present

## 2019-06-15 DIAGNOSIS — Z23 Encounter for immunization: Secondary | ICD-10-CM | POA: Diagnosis not present

## 2019-08-12 ENCOUNTER — Other Ambulatory Visit: Payer: Self-pay | Admitting: Physician Assistant

## 2019-08-12 DIAGNOSIS — Z1231 Encounter for screening mammogram for malignant neoplasm of breast: Secondary | ICD-10-CM

## 2019-08-22 ENCOUNTER — Ambulatory Visit (INDEPENDENT_AMBULATORY_CARE_PROVIDER_SITE_OTHER): Payer: Medicare Other

## 2019-08-22 ENCOUNTER — Other Ambulatory Visit: Payer: Self-pay

## 2019-08-22 DIAGNOSIS — Z1231 Encounter for screening mammogram for malignant neoplasm of breast: Secondary | ICD-10-CM | POA: Diagnosis not present

## 2019-08-25 ENCOUNTER — Encounter: Payer: Self-pay | Admitting: Physician Assistant

## 2019-08-25 NOTE — Progress Notes (Signed)
There was asymmetry of right breast seen in mammogram. More imaging is needed. The breast clinic should contact you.

## 2019-08-26 ENCOUNTER — Encounter: Payer: Self-pay | Admitting: Physician Assistant

## 2019-08-26 ENCOUNTER — Other Ambulatory Visit: Payer: Self-pay | Admitting: Physician Assistant

## 2019-08-26 DIAGNOSIS — R928 Other abnormal and inconclusive findings on diagnostic imaging of breast: Secondary | ICD-10-CM

## 2019-09-05 ENCOUNTER — Ambulatory Visit
Admission: RE | Admit: 2019-09-05 | Discharge: 2019-09-05 | Disposition: A | Discharge status: Home / Self Care | Payer: BC Managed Care – PPO | Source: Ambulatory Visit | Attending: Physician Assistant | Admitting: Physician Assistant

## 2019-09-05 ENCOUNTER — Ambulatory Visit
Admission: RE | Admit: 2019-09-05 | Discharge: 2019-09-05 | Disposition: A | Discharge status: Home / Self Care | Payer: Medicare Other | Source: Ambulatory Visit | Attending: Physician Assistant | Admitting: Physician Assistant

## 2019-09-05 ENCOUNTER — Other Ambulatory Visit: Payer: Self-pay

## 2019-09-05 DIAGNOSIS — R928 Other abnormal and inconclusive findings on diagnostic imaging of breast: Secondary | ICD-10-CM

## 2019-09-05 NOTE — Progress Notes (Signed)
GREAT news no evidence of malignancy. Go back to screening mammogram in one year.

## 2019-09-20 ENCOUNTER — Other Ambulatory Visit: Payer: Self-pay | Admitting: Physician Assistant

## 2019-09-25 ENCOUNTER — Encounter: Payer: Self-pay | Admitting: Physician Assistant

## 2019-09-25 NOTE — Telephone Encounter (Signed)
Yes that is fine

## 2019-10-08 NOTE — Progress Notes (Signed)
Subjective:   Leslie Oconnell is a 69 y.o. female who presents for Medicare Annual (Subsequent) preventive examination.  Review of Systems:  No ROS.  Medicare Wellness Virtual Visit.  Visual/audio telehealth visit, UTA vital signs.   See social history for additional risk factors.    Cardiac Risk Factors include: advanced age (>47men, >54 women);dyslipidemia;smoking/ tobacco exposure Sleep patterns: Getting 8 hours of sleep a night. Wakes up 1 time a night to void. Wakes up and feels rested and ready for the day. Home Safety/Smoke Alarms: Feels safe in home. Smoke alarms in place.  Living environment; Lives with husband in a 1 story home and handrails on the steps. Shower is a walk in shower and grab bars are in place. Seat Belt Safety/Bike Helmet: Wears seat belt.   Female:   Pap- Aged out      Mammo- UTD      Dexa scan- UTD       CCS- UTD     Objective:     Vitals: Ht 5\' 1"  (1.549 m)   Wt 119 lb (54 kg)   BMI 22.48 kg/m   Body mass index is 22.48 kg/m.  Advanced Directives 10/15/2019 07/24/2018  Does Patient Have a Medical Advance Directive? No No  Would patient like information on creating a medical advance directive? No - Patient declined No - Patient declined    Tobacco Social History   Tobacco Use  Smoking Status Current Every Day Smoker  . Packs/day: 0.50  . Years: 50.00  . Pack years: 25.00  . Types: Cigarettes  Smokeless Tobacco Never Used     Ready to quit: No Counseling given: No   Clinical Intake:  Pre-visit preparation completed: Yes  Pain : No/denies pain     Nutritional Risks: None Diabetes: No  How often do you need to have someone help you when you read instructions, pamphlets, or other written materials from your doctor or pharmacy?: 1 - Never What is the last grade level you completed in school?: 18  Interpreter Needed?: No  Information entered by :: Orlie Dakin, LPN  Past Medical History:  Diagnosis Date  . Hyperlipidemia     Past Surgical History:  Procedure Laterality Date  . [place]  . TONSILECTOMY/ADENOIDECTOMY WITH MYRINGOTOMY  1964   Family History  Problem Relation Age of Onset  . Cancer Mother   . Alzheimer's disease Father    Social History   Socioeconomic History  . Marital status: Married    Spouse name: Kyung Rudd  . Number of children: 1  . Years of education: Masters  . Highest education level: Master's degree (e.g., MA, MS, MEng, MEd, MSW, MBA)  Occupational History  . Occupation: retired    Comment: high Education officer, museum  Tobacco Use  . Smoking status: Current Every Day Smoker    Packs/day: 0.50    Years: 50.00    Pack years: 25.00    Types: Cigarettes  . Smokeless tobacco: Never Used  Substance and Sexual Activity  . Alcohol use: Yes    Comment: 2-3 glasses on weekends  . Drug use: No  . Sexual activity: Yes  Other Topics Concern  . Not on file  Social History Narrative   Walks daily. Cleans house. Plays games to stimulate brain   Social Determinants of Health   Financial Resource Strain:   . Difficulty of Paying Living Expenses: Not on file  Food Insecurity:   . Worried About Charity fundraiser in the Last Year:  Not on file  . Ran Out of Food in the Last Year: Not on file  Transportation Needs:   . Lack of Transportation (Medical): Not on file  . Lack of Transportation (Non-Medical): Not on file  Physical Activity:   . Days of Exercise per Week: Not on file  . Minutes of Exercise per Session: Not on file  Stress:   . Feeling of Stress : Not on file  Social Connections:   . Frequency of Communication with Friends and Family: Not on file  . Frequency of Social Gatherings with Friends and Family: Not on file  . Attends Religious Services: Not on file  . Active Member of Clubs or Organizations: Not on file  . Attends Archivist Meetings: Not on file  . Marital Status: Not on file    Outpatient Encounter Medications as of 10/15/2019   Medication Sig  . aspirin 81 MG tablet Take 81 mg by mouth daily.  [name] atorvastatin (LIPITOR) 10 MG tablet TAKE 1 TABLET BY MOUTH EVERY DAY/APPT and LABS FOR FURTHER REFILLS  . calcium carbonate (OS-CAL) 600 MG TABS Take 600 mg by mouth daily.  [name] CINNAMON PO Take 2,000 mg by mouth daily.  . Collagen Hydrolysate, Bovine, POWD by Does not apply route.  . fish oil-omega-3 fatty acids 1000 MG capsule Take 2 g by mouth daily.  . Red Yeast Rice 600 MG CAPS Take 1,200 mg by mouth daily.  . sodium chloride (OCEAN) 0.65 % SOLN nasal spray Place 1 spray into both nostrils as needed.   No facility-administered encounter medications on file as of 10/15/2019.    Activities of Daily Living In your present state of health, do you have any difficulty performing the following activities: 10/15/2019  Hearing? Y  Comment related to sinuses  Vision? N  Difficulty concentrating or making decisions? N  Walking or climbing stairs? N  Dressing or bathing? N  Doing errands, shopping? N  Preparing Food and eating ? N  Using the Toilet? N  In the past six months, have you accidently leaked urine? N  Do you have problems with loss of bowel control? N  Managing your Medications? N  Managing your Finances? N  Housekeeping or managing your Housekeeping? N  Some recent data might be hidden    Patient Care Team: Lavada Mesi as PCP - General (Family Medicine)    Assessment:   This is a routine wellness examination for [name].Physical assessment deferred to PCP.   Exercise Activities and Dietary recommendations Current Exercise Habits: Home exercise routine, Type of exercise: walking(2 miles), Time (Minutes): 30, Frequency (Times/Week): 5, Weekly Exercise (Minutes/Week): 150, Intensity: Moderate, Exercise limited by: None identified Diet Eats a healthy diet of vegetables, fruits and proteins. Breakfast: Yogurt and granola bar Lunch: Cheese toast and sandwich meat apple Dinner: Meat and  vegetables Drinks water daily.      Goals    . Patient Stated     Patient stated would like to eat more vegetables and fruits on a daily basis.       Fall Risk Fall Risk  10/15/2019 07/24/2018 04/06/2018  Falls in the past year? 0 0 No  Comment - - Emmi Telephone Survey: data to providers prior to load  Risk for fall due to : No Fall Risks - -  Follow up Falls prevention discussed Falls prevention discussed -   Is the patient's home free of loose throw rugs in walkways, pet beds, electrical cords, etc?   yes  Grab bars in the bathroom? yes      Handrails on the stairs?   yes      Adequate lighting?   yes   Depression Screen PHQ 2/9 Scores 10/15/2019 07/24/2018 07/12/2017  PHQ - 2 Score 0 0 0     Cognitive Function     6CIT Screen 10/15/2019 07/24/2018 07/04/2016  What Year? 0 points 0 points 0 points  What month? 0 points 0 points 0 points  What time? 0 points 0 points 0 points  Count back from 20 0 points 0 points 0 points  Months in reverse 2 points 0 points 0 points  Repeat phrase 0 points 0 points 2 points  Total Score 2 0 2    Immunization History  Administered Date(s) Administered  . Fluad Quad(high Dose 65+) 06/15/2019  . Influenza,inj,Quad PF,6+ Mos 05/06/2015, 07/04/2016, 07/12/2017, 07/24/2018  . Pneumococcal Conjugate-13 07/04/2016  . Pneumococcal Polysaccharide-23 07/12/2017  . Tdap 11/27/2013  . Zoster 04/18/2013    Screening Tests Health Maintenance  Topic Date Due  . MAMMOGRAM  08/21/2021  . TETANUS/TDAP  11/28/2023  . COLONOSCOPY  09/08/2025  . INFLUENZA VACCINE  Completed  . DEXA SCAN  Completed  . Hepatitis C Screening  Completed  . PNA vac Low Risk Adult  Completed      Plan:    Please schedule your next medicare wellness visit with me in 1 yr.  Ms. Latner , Thank you for taking time to come for your Medicare Wellness Visit. I appreciate your ongoing commitment to your health goals. Please review the following plan we discussed and let  me know if I can assist you in the future.  Continue doing brain stimulating activities (puzzles, reading, adult coloring books, staying active) to keep memory sharp.    These are the goals we discussed: Goals    . Patient Stated     Patient stated would like to eat more vegetables and fruits on a daily basis.       This is a list of the screening recommended for you and due dates:  Health Maintenance  Topic Date Due  . Mammogram  08/21/2021  . Tetanus Vaccine  11/28/2023  . Colon Cancer Screening  09/08/2025  . Flu Shot  Completed  . DEXA scan (bone density measurement)  Completed  .  Hepatitis C: One time screening is recommended by Center for Disease Control  (CDC) for  adults born from [birth year] through 1966.   Completed  . Pneumonia vaccines  Completed     I have personally reviewed and noted the following in the patient's chart:   . Medical and social history . Use of alcohol, tobacco or illicit drugs  . Current medications and supplements . Functional ability and status . Nutritional status . Physical activity . Advanced directives . List of other physicians . Hospitalizations, surgeries, and ER visits in previous 12 months . Vitals . Screenings to include cognitive, depression, and falls . Referrals and appointments  In addition, I have reviewed and discussed with patient certain preventive protocols, quality metrics, and best practice recommendations. A written personalized care plan for preventive services as well as general preventive health recommendations were provided to patient.     Joanne Chars, LPN  [phone]

## 2019-10-11 ENCOUNTER — Encounter: Payer: Self-pay | Admitting: Physician Assistant

## 2019-10-15 ENCOUNTER — Ambulatory Visit (INDEPENDENT_AMBULATORY_CARE_PROVIDER_SITE_OTHER): Payer: Medicare Other | Admitting: *Deleted

## 2019-10-15 VITALS — Ht 61.0 in | Wt 119.0 lb

## 2019-10-15 DIAGNOSIS — E782 Mixed hyperlipidemia: Secondary | ICD-10-CM | POA: Diagnosis not present

## 2019-10-15 DIAGNOSIS — Z0000 Encounter for general adult medical examination without abnormal findings: Secondary | ICD-10-CM | POA: Diagnosis not present

## 2019-10-15 NOTE — Patient Instructions (Addendum)
Please schedule your next medicare wellness visit with me in 1 yr.  Ms. Hegstad , Thank you for taking time to come for your Medicare Wellness Visit. I appreciate your ongoing commitment to your health goals. Please review the following plan we discussed and let me know if I can assist you in the future.  Continue doing brain stimulating activities (puzzles, reading, adult coloring books, staying active) to keep memory sharp.  These are the goals we discussed: Goals    . Patient Stated     Patient stated would like to eat more vegetables and fruits on a daily basis.

## 2019-10-22 DIAGNOSIS — Z0000 Encounter for general adult medical examination without abnormal findings: Secondary | ICD-10-CM | POA: Diagnosis not present

## 2019-10-22 DIAGNOSIS — E782 Mixed hyperlipidemia: Secondary | ICD-10-CM | POA: Diagnosis not present

## 2019-10-22 LAB — LIPID PANEL
Cholesterol: 160 mg/dL (ref <200)
HDL: 63 mg/dL (ref >=50)
LDL Cholesterol (Calc): 81 mg/dL (calc)
Non-HDL Cholesterol (Calc): 97 mg/dL (calc) (ref <130)
Total CHOL/HDL Ratio: 2.5 (calc) (ref <5.0)
Triglycerides: 84 mg/dL (ref <150)

## 2019-10-22 LAB — COMPLETE METABOLIC PANEL WITH GFR
AG Ratio: 2.1 (calc) (ref 1.0–2.5)
ALT: 12 U/L (ref 6–29)
AST: 13 U/L (ref 10–35)
Albumin: 4.2 g/dL (ref 3.6–5.1)
Alkaline phosphatase (APISO): 45 U/L (ref 37–153)
BUN: 11 mg/dL (ref 7–25)
CO2: 35 mmol/L — ABNORMAL HIGH (ref 20–32)
Calcium: 9.3 mg/dL (ref 8.6–10.4)
Chloride: 105 mmol/L (ref 98–110)
Creat: 0.75 mg/dL (ref 0.50–0.99)
GFR, Est African American: 94 mL/min/{1.73_m2} (ref >=60)
GFR, Est Non African American: 81 mL/min/{1.73_m2} (ref >=60)
Globulin: 2 g/dL (calc) (ref 1.9–3.7)
Glucose, Bld: 92 mg/dL (ref 65–99)
Potassium: 4.8 mmol/L (ref 3.5–5.3)
Sodium: 141 mmol/L (ref 135–146)
Total Bilirubin: 0.6 mg/dL (ref 0.2–1.2)
Total Protein: 6.2 g/dL (ref 6.1–8.1)

## 2019-10-22 LAB — CBC
HCT: 45.7 % — ABNORMAL HIGH (ref 35.0–45.0)
Hemoglobin: 15.4 g/dL (ref 11.7–15.5)
MCH: 32.4 pg (ref 27.0–33.0)
MCHC: 33.7 g/dL (ref 32.0–36.0)
MCV: 96 fL (ref 80.0–100.0)
MPV: 9.6 fL (ref 7.5–12.5)
Platelets: 256 10*3/uL (ref 140–400)
RBC: 4.76 10*6/uL (ref 3.80–5.10)
RDW: 12.9 % (ref 11.0–15.0)
WBC: 5.3 10*3/uL (ref 3.8–10.8)

## 2019-10-23 ENCOUNTER — Encounter: Payer: Self-pay | Admitting: Physician Assistant

## 2019-10-23 NOTE — Progress Notes (Signed)
Wealthy,   Cholesterol looks good. Continue on lipitor. Do you need refills?  Kidney and liver look great.  Blood is a little thick likely because of smoking hematocrit elevated.  CO2 is elevated in blood. We can see this if lungs are having a hard time removing it. How is your breathing?

## 2019-11-01 ENCOUNTER — Ambulatory Visit: Payer: Medicare Other | Attending: Internal Medicine

## 2019-11-01 DIAGNOSIS — Z23 Encounter for immunization: Secondary | ICD-10-CM | POA: Insufficient documentation

## 2019-11-01 NOTE — Progress Notes (Signed)
   Covid-19 Vaccination Clinic  Name:  Leslie Oconnell    MRN: [id number] DOB: 1951/01/20  11/01/2019  [name] was observed post Covid-19 immunization for 15 minutes without incidence. She was provided with Vaccine Information Sheet and instruction to access the V-Safe system.   [name] was instructed to call 911 with any severe reactions post vaccine: [name] Difficulty breathing  . Swelling of your face and throat  . A fast heartbeat  . A bad rash all over your body  . Dizziness and weakness    Immunizations Administered    Name Date Dose VIS Date Route   Pfizer COVID-19 Vaccine 11/01/2019  1:45 PM 0.3 mL 08/16/2019 Intramuscular   Manufacturer: [place]   Lot: [id number]   [place]: [id number]

## 2019-11-27 ENCOUNTER — Ambulatory Visit: Payer: Medicare Other | Attending: Internal Medicine

## 2019-11-27 DIAGNOSIS — Z23 Encounter for immunization: Secondary | ICD-10-CM

## 2019-11-27 NOTE — Progress Notes (Signed)
   Covid-19 Vaccination Clinic  Name:  Tenisa Fleetwood    MRN: [id number] DOB: [birth year]  11/27/2019  Ms. Tarantino was observed post Covid-19 immunization for 15 minutes without incident. She was provided with Vaccine Information Sheet and instruction to access the V-Safe system.   Ms. Cuadra was instructed to call 911 with any severe reactions post vaccine: [name] Difficulty breathing  . Swelling of face and throat  . A fast heartbeat  . A bad rash all over body  . Dizziness and weakness   Immunizations Administered    Name Date Dose VIS Date Route   Pfizer COVID-19 Vaccine 11/27/2019  8:14 AM 0.3 mL 08/16/2019 Intramuscular   Manufacturer: [place]   Lot: [id number]   [place]: [id number]

## 2019-12-18 ENCOUNTER — Other Ambulatory Visit: Payer: Self-pay | Admitting: Physician Assistant

## 2020-05-24 DIAGNOSIS — Z23 Encounter for immunization: Secondary | ICD-10-CM | POA: Diagnosis not present

## 2020-06-19 DIAGNOSIS — Z23 Encounter for immunization: Secondary | ICD-10-CM | POA: Diagnosis not present

## 2020-07-01 DIAGNOSIS — D1801 Hemangioma of skin and subcutaneous tissue: Secondary | ICD-10-CM | POA: Diagnosis not present

## 2020-07-01 DIAGNOSIS — L821 Other seborrheic keratosis: Secondary | ICD-10-CM | POA: Diagnosis not present

## 2020-09-15 DIAGNOSIS — H0100B Unspecified blepharitis left eye, upper and lower eyelids: Secondary | ICD-10-CM | POA: Diagnosis not present

## 2020-09-15 DIAGNOSIS — H43813 Vitreous degeneration, bilateral: Secondary | ICD-10-CM | POA: Diagnosis not present

## 2020-09-15 DIAGNOSIS — H02834 Dermatochalasis of left upper eyelid: Secondary | ICD-10-CM | POA: Diagnosis not present

## 2020-09-15 DIAGNOSIS — H527 Unspecified disorder of refraction: Secondary | ICD-10-CM | POA: Diagnosis not present

## 2020-09-15 DIAGNOSIS — H25813 Combined forms of age-related cataract, bilateral: Secondary | ICD-10-CM | POA: Diagnosis not present

## 2020-09-15 DIAGNOSIS — H0100A Unspecified blepharitis right eye, upper and lower eyelids: Secondary | ICD-10-CM | POA: Diagnosis not present

## 2020-09-15 DIAGNOSIS — H02831 Dermatochalasis of right upper eyelid: Secondary | ICD-10-CM | POA: Diagnosis not present

## 2020-09-29 ENCOUNTER — Encounter: Payer: Self-pay | Admitting: Physician Assistant

## 2020-09-30 ENCOUNTER — Other Ambulatory Visit: Payer: Self-pay | Admitting: Physician Assistant

## 2020-09-30 DIAGNOSIS — Z1231 Encounter for screening mammogram for malignant neoplasm of breast: Secondary | ICD-10-CM

## 2020-11-04 ENCOUNTER — Ambulatory Visit: Payer: Medicare Other

## 2020-11-12 ENCOUNTER — Other Ambulatory Visit: Payer: Self-pay

## 2020-11-12 ENCOUNTER — Ambulatory Visit (INDEPENDENT_AMBULATORY_CARE_PROVIDER_SITE_OTHER): Payer: Medicare Other

## 2020-11-12 DIAGNOSIS — Z1231 Encounter for screening mammogram for malignant neoplasm of breast: Secondary | ICD-10-CM | POA: Diagnosis not present

## 2020-11-16 ENCOUNTER — Encounter: Payer: Self-pay | Admitting: Physician Assistant

## 2020-11-16 NOTE — Progress Notes (Signed)
Leslie Oconnell,   You do have some abnormal findings of the right breast. You will need more images to get a better look. Imaging should be calling you in the next few days to schedule.

## 2020-11-17 ENCOUNTER — Other Ambulatory Visit: Payer: Self-pay | Admitting: Physician Assistant

## 2020-11-17 DIAGNOSIS — R928 Other abnormal and inconclusive findings on diagnostic imaging of breast: Secondary | ICD-10-CM

## 2020-12-04 ENCOUNTER — Ambulatory Visit: Payer: Medicare Other

## 2020-12-04 ENCOUNTER — Ambulatory Visit
Admission: RE | Admit: 2020-12-04 | Discharge: 2020-12-04 | Disposition: A | Discharge status: Home / Self Care | Payer: Medicare Other | Source: Ambulatory Visit | Attending: Physician Assistant | Admitting: Physician Assistant

## 2020-12-04 ENCOUNTER — Other Ambulatory Visit: Payer: Self-pay

## 2020-12-04 ENCOUNTER — Other Ambulatory Visit: Payer: Self-pay | Admitting: Physician Assistant

## 2020-12-04 DIAGNOSIS — N6489 Other specified disorders of breast: Secondary | ICD-10-CM | POA: Diagnosis not present

## 2020-12-04 DIAGNOSIS — R928 Other abnormal and inconclusive findings on diagnostic imaging of breast: Secondary | ICD-10-CM

## 2020-12-04 DIAGNOSIS — R921 Mammographic calcification found on diagnostic imaging of breast: Secondary | ICD-10-CM | POA: Diagnosis not present

## 2020-12-04 NOTE — Progress Notes (Signed)
Calcifications of breast bilaterally. Needs biopsy. Imaging should contact you to get this scheduled.

## 2020-12-14 ENCOUNTER — Ambulatory Visit
Admission: RE | Admit: 2020-12-14 | Discharge: 2020-12-14 | Disposition: A | Discharge status: Home / Self Care | Payer: Medicare Other | Source: Ambulatory Visit | Attending: Physician Assistant | Admitting: Physician Assistant

## 2020-12-14 ENCOUNTER — Other Ambulatory Visit: Payer: Self-pay

## 2020-12-14 DIAGNOSIS — R928 Other abnormal and inconclusive findings on diagnostic imaging of breast: Secondary | ICD-10-CM

## 2020-12-14 DIAGNOSIS — N6011 Diffuse cystic mastopathy of right breast: Secondary | ICD-10-CM | POA: Diagnosis not present

## 2020-12-14 DIAGNOSIS — R921 Mammographic calcification found on diagnostic imaging of breast: Secondary | ICD-10-CM | POA: Diagnosis not present

## 2020-12-14 DIAGNOSIS — N6012 Diffuse cystic mastopathy of left breast: Secondary | ICD-10-CM | POA: Diagnosis not present

## 2020-12-15 NOTE — Progress Notes (Signed)
Great news to hear no cancerous cells found. Fibrocystic masses of breast.

## 2020-12-23 ENCOUNTER — Other Ambulatory Visit: Payer: Self-pay | Admitting: Physician Assistant

## 2020-12-23 ENCOUNTER — Encounter: Payer: Self-pay | Admitting: Physician Assistant

## 2020-12-23 DIAGNOSIS — M8589 Other specified disorders of bone density and structure, multiple sites: Secondary | ICD-10-CM

## 2020-12-23 DIAGNOSIS — E782 Mixed hyperlipidemia: Secondary | ICD-10-CM

## 2020-12-24 NOTE — Telephone Encounter (Signed)
Patient has been scheduled for her med well visit. AM

## 2020-12-28 ENCOUNTER — Encounter: Payer: Self-pay | Admitting: Physician Assistant

## 2020-12-29 DIAGNOSIS — E782 Mixed hyperlipidemia: Secondary | ICD-10-CM | POA: Diagnosis not present

## 2020-12-29 DIAGNOSIS — M8589 Other specified disorders of bone density and structure, multiple sites: Secondary | ICD-10-CM | POA: Diagnosis not present

## 2020-12-29 LAB — COMPLETE METABOLIC PANEL WITH GFR
AG Ratio: 1.8 (calc) (ref 1.0–2.5)
ALT: 11 U/L (ref 6–29)
AST: 16 U/L (ref 10–35)
Albumin: 4.5 g/dL (ref 3.6–5.1)
Alkaline phosphatase (APISO): 49 U/L (ref 37–153)
BUN: 9 mg/dL (ref 7–25)
CO2: 29 mmol/L (ref 20–32)
Calcium: 9.5 mg/dL (ref 8.6–10.4)
Chloride: 103 mmol/L (ref 98–110)
Creat: 0.79 mg/dL (ref 0.60–0.93)
GFR, Est African American: 88 mL/min/{1.73_m2} (ref >=60)
GFR, Est Non African American: 76 mL/min/{1.73_m2} (ref >=60)
Globulin: 2.5 g/dL (calc) (ref 1.9–3.7)
Glucose, Bld: 91 mg/dL (ref 65–99)
Potassium: 4.4 mmol/L (ref 3.5–5.3)
Sodium: 140 mmol/L (ref 135–146)
Total Bilirubin: 0.7 mg/dL (ref 0.2–1.2)
Total Protein: 7 g/dL (ref 6.1–8.1)

## 2020-12-29 LAB — LIPID PANEL W/REFLEX DIRECT LDL
Cholesterol: 176 mg/dL (ref <200)
HDL: 70 mg/dL (ref >=50)
LDL Cholesterol (Calc): 84 mg/dL (calc)
Non-HDL Cholesterol (Calc): 106 mg/dL (calc) (ref <130)
Total CHOL/HDL Ratio: 2.5 (calc) (ref <5.0)
Triglycerides: 120 mg/dL (ref <150)

## 2020-12-29 LAB — CBC WITH DIFFERENTIAL/PLATELET
Absolute Monocytes: 306 cells/uL (ref 200–950)
Basophils Absolute: 61 cells/uL (ref 0–200)
Basophils Relative: 1.3 %
Eosinophils Absolute: 118 cells/uL (ref 15–500)
Eosinophils Relative: 2.5 %
HCT: 45.3 % — ABNORMAL HIGH (ref 35.0–45.0)
Hemoglobin: 15.4 g/dL (ref 11.7–15.5)
Lymphs Abs: 1645 cells/uL (ref 850–3900)
MCH: 32.8 pg (ref 27.0–33.0)
MCHC: 34 g/dL (ref 32.0–36.0)
MCV: 96.6 fL (ref 80.0–100.0)
MPV: 9.4 fL (ref 7.5–12.5)
Monocytes Relative: 6.5 %
Neutro Abs: 2571 cells/uL (ref 1500–7800)
Neutrophils Relative %: 54.7 %
Platelets: 241 10*3/uL (ref 140–400)
RBC: 4.69 10*6/uL (ref 3.80–5.10)
RDW: 12.3 % (ref 11.0–15.0)
Total Lymphocyte: 35 %
WBC: 4.7 10*3/uL (ref 3.8–10.8)

## 2020-12-30 ENCOUNTER — Encounter: Payer: Self-pay | Admitting: Physician Assistant

## 2020-12-30 MED ORDER — ATORVASTATIN CALCIUM 10 MG PO TABS: ORAL_TABLET | ORAL | 3 refills | Status: DC

## 2020-12-30 NOTE — Telephone Encounter (Signed)
Leslie Oconnell,   HDL, good cholesterol, looks great.  LDL, bad cholesterol, looks good.  Kidney, liver, glucose look great.  No concerns.

## 2021-01-15 ENCOUNTER — Other Ambulatory Visit: Payer: Self-pay

## 2021-01-15 ENCOUNTER — Ambulatory Visit (INDEPENDENT_AMBULATORY_CARE_PROVIDER_SITE_OTHER): Payer: Medicare Other | Admitting: Physician Assistant

## 2021-01-15 VITALS — BP 124/67 | HR 81 | Ht 61.0 in | Wt 121.1 lb

## 2021-01-15 DIAGNOSIS — Z0000 Encounter for general adult medical examination without abnormal findings: Secondary | ICD-10-CM | POA: Diagnosis not present

## 2021-01-15 DIAGNOSIS — M8589 Other specified disorders of bone density and structure, multiple sites: Secondary | ICD-10-CM | POA: Diagnosis not present

## 2021-01-15 NOTE — Progress Notes (Signed)
MEDICARE ANNUAL WELLNESS VISIT  01/15/2021  Subjective:  Leslie Oconnell is a 70 y.o. female patient of Alden Hipp, Royetta Car, PA-C who had a [employer] Visit today. Leslie Oconnell is Retired and lives with their spouse. she has 1 child. she reports that she is socially active and does interact with friends/family regularly. she is moderately physically active and enjoys playing games on the computer and doing crossword puzzles.  Patient Care Team: Lavada Mesi as PCP - General (Family Medicine)  Advanced Directives 01/15/2021 10/15/2019 07/24/2018  Does Patient Have a Medical Advance Directive? Yes No No  Type of Advance Directive Living will;Healthcare Power of Attorney - -  Does patient want to make changes to medical advance directive? No - Patient declined - -  Copy of [place] in Chart? No - copy requested - -  Would patient like information on creating a medical advance directive? - No - Patient declined No - Patient declined    Hospital Utilization Over the Past 12 Months: # of hospitalizations or ER visits: 0 # of surgeries: 0  Review of Systems    Patient reports that her overall health is unchanged when compared to last year.  Review of Systems: History obtained from chart review and the patient  All other systems negative.  Pain Assessment Pain : No/denies pain     Current Medications & Allergies (verified) Allergies as of 01/15/2021   No Known Allergies     Medication List       Accurate as of Jan 15, 2021 10:42 AM. If you have any questions, ask your nurse or doctor.        aspirin 81 MG tablet Take 81 mg by mouth daily.   atorvastatin 10 MG tablet Commonly known as: LIPITOR TAKE 1 TABLET BY MOUTH EVERY DAY/ LABS FOR REFILLS   calcium carbonate 600 MG Tabs tablet Commonly known as: OS-CAL Take 600 mg by mouth daily.   CINNAMON PO Take 2,000 mg by mouth daily.   Collagen Hydrolysate (Bovine) Powd by Does not apply  route.   fish oil-omega-3 fatty acids 1000 MG capsule Take 2 g by mouth daily.   Red Yeast Rice 600 MG Caps Take 1,200 mg by mouth daily.   sodium chloride 0.65 % Soln nasal spray Commonly known as: OCEAN Place 1 spray into both nostrils as needed.       History (reviewed): Past Medical History:  Diagnosis Date  . Hyperlipidemia    Past Surgical History:  Procedure Laterality Date  . [place]  . TONSILECTOMY/ADENOIDECTOMY WITH MYRINGOTOMY  1964   Family History  Problem Relation Age of Onset  . Cancer Mother   . Alzheimer's disease Father   . Breast cancer Maternal Aunt   . Breast cancer Maternal Aunt    Social History   Socioeconomic History  . Marital status: Married    Spouse name: Kyung Rudd  . Number of children: 1  . Years of education: Masters  . Highest education level: Master's degree (e.g., MA, MS, MEng, MEd, MSW, MBA)  Occupational History  . Occupation: retired    Comment: high Education officer, museum  Tobacco Use  . Smoking status: Current Every Day Smoker    Packs/day: 0.50    Years: 50.00    Pack years: 25.00    Types: Cigarettes  . Smokeless tobacco: Never Used  Vaping Use  . Vaping Use: Never used  Substance and Sexual Activity  . Alcohol use: Yes  Comment: 2-3 glasses on weekends  . Drug use: No  . Sexual activity: Yes  Other Topics Concern  . Not on file  Social History Narrative   Lives with her husband. Likes to do crossword puzzles occasionally. She wants 2 miles a day.   Social Determinants of Health   Financial Resource Strain: Low Risk   . Difficulty of Paying Living Expenses: Not hard at all  Food Insecurity: No Food Insecurity  . Worried About Charity fundraiser in the Last Year: Never true  . Ran Out of Food in the Last Year: Never true  Transportation Needs: No Transportation Needs  . Lack of Transportation (Medical): No  . Lack of Transportation (Non-Medical): No  Physical Activity: Sufficiently Active  . Days of  Exercise per Week: 7 days  . Minutes of Exercise per Session: 30 min  Stress: No Stress Concern Present  . Feeling of Stress : Not at all  Social Connections: Moderately Integrated  . Frequency of Communication with Friends and Family: Three times a week  . Frequency of Social Gatherings with Friends and Family: Once a week  . Attends Religious Services: More than 4 times per year  . Active Member of Clubs or Organizations: No  . Attends Archivist Meetings: Never  . Marital Status: Married    Activities of Daily Living In your present state of health, do you have any difficulty performing the following activities: 01/15/2021  Hearing? N  Vision? N  Difficulty concentrating or making decisions? N  Walking or climbing stairs? N  Dressing or bathing? N  Doing errands, shopping? N  Preparing Food and eating ? N  Using the Toilet? N  In the past six months, have you accidently leaked urine? Y  Comment Once in while, stress incontinence  Do you have problems with loss of bowel control? N  Managing your Medications? N  Managing your Finances? N  Housekeeping or managing your Housekeeping? N  Some recent data might be hidden    Patient Education/Literacy How often do you need to have someone help you when you read instructions, pamphlets, or other written materials from your doctor or pharmacy?: 1 - Never What is the last grade level you completed in school?: Masters Degree  Exercise Current Exercise Habits: Home exercise routine, Type of exercise: walking, Time (Minutes): 30, Frequency (Times/Week): >7, Weekly Exercise (Minutes/Week): 0, Intensity: Moderate, Exercise limited by: None identified  Diet Patient reports consuming 2 meals a day and 2 snack(s) a day Patient reports that her primary diet is: Regular Patient reports that she does have regular access to food.   Depression Screen PHQ 2/9 Scores 01/15/2021 10/15/2019 07/24/2018 07/12/2017  PHQ - 2 Score 0 0 0 0      Fall Risk Fall Risk  01/15/2021 10/15/2019 07/24/2018 04/06/2018  Falls in the past year? 0 0 0 No  Comment - - - Emmi Telephone Survey: data to providers prior to load  Number falls in past yr: 0 - - -  Injury with Fall? 0 - - -  Risk for fall due to : No Fall Risks No Fall Risks - -  Follow up Falls evaluation completed Falls prevention discussed Falls prevention discussed -     Objective:   BP 124/67 (BP Location: Right Arm, Patient Position: Sitting)   Pulse 81   Ht 5\' 1"  (1.549 m)   Wt 121 lb 1.3 oz (54.9 kg)   SpO2 99%   BMI 22.88 kg/m  Last Weight  Most recent update: 01/15/2021 10:29 AM   Weight  54.9 kg (121 lb 1.3 oz)            Body mass index is 22.88 kg/m.  Hearing/Vision  . Analissa did not have difficulty with hearing/understanding during the face-to-face interview . Payzleigh did not have difficulty with her vision during the face-to-face interview . Reports that she has had a formal eye exam by an eye care professional within the past year . Reports that she has not had a formal hearing evaluation within the past year  Cognitive Function: 6CIT Screen 01/15/2021 10/15/2019 07/24/2018 07/04/2016  What Year? 0 points 0 points 0 points 0 points  What month? 0 points 0 points 0 points 0 points  What time? 0 points 0 points 0 points 0 points  Count back from 20 0 points 0 points 0 points 0 points  Months in reverse 0 points 2 points 0 points 0 points  Repeat phrase 0 points 0 points 0 points 2 points  Total Score 0 2 0 2    Normal Cognitive Function Screening: Yes (Normal:0-7, Significant for Dysfunction: >8)  Immunization & Health Maintenance Record Immunization History  Administered Date(s) Administered  . Fluad Quad(high Dose 65+) 06/15/2019  . Influenza, High Dose Seasonal PF 05/24/2020  . Influenza,inj,Quad PF,6+ Mos 05/06/2015, 07/04/2016, 07/12/2017, 07/24/2018  . PFIZER(Purple Top)SARS-COV-2 Vaccination 11/01/2019, 11/27/2019, 06/19/2020  .  Pneumococcal Conjugate-13 07/04/2016  . Pneumococcal Polysaccharide-23 07/12/2017  . Tdap 11/27/2013  . Zoster 04/18/2013  . Zoster Recombinat (Shingrix) 11/06/2020    Health Maintenance  Topic Date Due  . COVID-19 Vaccine (4 - Booster for [place] series) 01/31/2021 (Originally 09/19/2020)  . INFLUENZA VACCINE  04/05/2021  . MAMMOGRAM  12/05/2022  . TETANUS/TDAP  11/28/2023  . COLONOSCOPY (Pts 45-33yrs Insurance coverage will need to be confirmed)  09/08/2025  . DEXA SCAN  Completed  . Hepatitis C Screening  Completed  . PNA vac Low Risk Adult  Completed  . HPV VACCINES  Aged Out       Assessment  This is a routine wellness examination for Fox Dally.  Health Maintenance: Due or Overdue There are no preventive care reminders to display for this patient.  Eldeen Kary does not need a referral for Community Assistance: Care Management:   no Social Work:    no Prescription Assistance:  no Nutrition/Diabetes Education:  no   Plan:  Personalized Goals Goals Addressed              This Visit's Progress   .  Patient Stated (pt-stated)        01/15/2021 AWV Goal: Improved Nutrition/Diet  . Patient will verbalize understanding that diet plays an important role in overall health and that a poor diet is a risk factor for many chronic medical conditions.  . Over the next year, patient will improve self management of their diet by incorporating more fruits and vegetables. . Patient will utilize available community resources to help with food acquisition if needed (ex: food pantries, [address], etc) . Patient will work with nutrition specialist if a referral was made       Personalized Health Maintenance & Screening Recommendations  Shingrix vaccine - patient stated she is getting the second dose today.  Lung Cancer Screening Recommended: yes (Low Dose CT Chest recommended if Age 77-80 years, 30 pack-year currently smoking OR have quit w/in past 15 years) Hepatitis C Screening  recommended: no HIV Screening recommended: no  Advanced Directives: Written information  was not given per the patient's request.  Referrals & Orders Orders Placed This Encounter  Procedures  . [place]    Follow-up Plan . Follow-up with Donella Stade, PA-C as planned . Please let us know if you want to have the lung cancer screening. . Medicare wellness in one year.   I have personally reviewed and noted the following in the patient's chart:   . Medical and social history . Use of alcohol, tobacco or illicit drugs  . Current medications and supplements . Functional ability and status . Nutritional status . Physical activity . Advanced directives . List of other physicians . Hospitalizations, surgeries, and ER visits in previous 12 months . Vitals . Screenings to include cognitive, depression, and falls . Referrals and appointments  In addition, I have reviewed and discussed with patient certain preventive protocols, quality metrics, and best practice recommendations. A written personalized care plan for preventive services as well as general preventive health recommendations were provided to patient.     Tinnie Gens  01/15/2021

## 2021-01-15 NOTE — Patient Instructions (Addendum)
[place] Maintenance Summary and Written Plan of Care  Leslie Oconnell ,  Thank you for allowing me to perform your Medicare Annual Wellness Visit and for your ongoing commitment to your health.   Health Maintenance & Immunization History Health Maintenance  Topic Date Due  . COVID-19 Vaccine (4 - Booster for [place] series) 01/31/2021 (Originally 09/19/2020)  . INFLUENZA VACCINE  04/05/2021  . MAMMOGRAM  12/05/2022  . TETANUS/TDAP  11/28/2023  . COLONOSCOPY (Pts 45-57yrs Insurance coverage will need to be confirmed)  09/08/2025  . DEXA SCAN  Completed  . Hepatitis C Screening  Completed  . PNA vac Low Risk Adult  Completed  . HPV VACCINES  Aged Out   Immunization History  Administered Date(s) Administered  . Fluad Quad(high Dose 65+) 06/15/2019  . Influenza, High Dose Seasonal PF 05/24/2020  . Influenza,inj,Quad PF,6+ Mos 05/06/2015, 07/04/2016, 07/12/2017, 07/24/2018  . PFIZER(Purple Top)SARS-COV-2 Vaccination 11/01/2019, 11/27/2019, 06/19/2020  . Pneumococcal Conjugate-13 07/04/2016  . Pneumococcal Polysaccharide-23 07/12/2017  . Tdap 11/27/2013  . Zoster 04/18/2013  . Zoster Recombinat (Shingrix) 11/06/2020    These are the patient goals that we discussed: Goals Addressed              This Visit's Progress   .  Patient Stated (pt-stated)        01/15/2021 AWV Goal: Improved Nutrition/Diet  . Patient will verbalize understanding that diet plays an important role in overall health and that a poor diet is a risk factor for many chronic medical conditions.  . Over the next year, patient will improve self management of their diet by incorporating more fruits and vegetables. . Patient will utilize available community resources to help with food acquisition if needed (ex: food pantries, [address], etc) . Patient will work with nutrition specialist if a referral was made         This is a list of Health Maintenance Items that are overdue or due  now: Shingrix vaccine   Orders/Referrals Placed Today: Orders Placed This Encounter  Procedures  . DEXAScan    Standing Status:   Future    Standing Expiration Date:   01/15/2022    Scheduling Instructions:     Please call patient to schedule. She would like have it done at the end of June/beginning of July.    Order Specific Question:   Reason for exam:    Answer:   History of osteopenia    Order Specific Question:   Preferred imaging location?    Answer:   [facility]   (Contact our referral department at [phone] if you have not spoken with someone about your referral appointment within the next 5 days)    Follow-up Plan . Follow-up with Donella Stade, PA-C as planned . Please let us know if you want to have the lung cancer screening. . Medicare wellness in one year.      Health Maintenance, Female Adopting a healthy lifestyle and getting preventive care are important in promoting health and wellness. Ask your health care provider about:  The right schedule for you to have regular tests and exams.  Things you can do on your own to prevent diseases and keep yourself healthy. What should I know about diet, weight, and exercise? Eat a healthy diet  Eat a diet that includes plenty of vegetables, fruits, low-fat dairy products, and lean protein.  Do not eat a lot of foods that are high in solid fats, added sugars, or sodium.  Maintain a healthy weight Body mass index (BMI) is used to identify weight problems. It estimates body fat based on height and weight. Your health care provider can help determine your BMI and help you achieve or maintain a healthy weight. Get regular exercise Get regular exercise. This is one of the most important things you can do for your health. Most adults should:  Exercise for at least 150 minutes each week. The exercise should increase your heart rate and make you sweat (moderate-intensity exercise).  Do strengthening  exercises at least twice a week. This is in addition to the moderate-intensity exercise.  Spend less time sitting. Even light physical activity can be beneficial. Watch cholesterol and blood lipids Have your blood tested for lipids and cholesterol at 71 years of age, then have this test every 5 years. Have your cholesterol levels checked more often if:  Your lipid or cholesterol levels are high.  You are older than 71 years of age.  You are at high risk for heart disease. What should I know about cancer screening? Depending on your health history and family history, you may need to have cancer screening at various ages. This may include screening for:  Breast cancer.  Cervical cancer.  Colorectal cancer.  Skin cancer.  Lung cancer. What should I know about heart disease, diabetes, and high blood pressure? Blood pressure and heart disease  High blood pressure causes heart disease and increases the risk of stroke. This is more likely to develop in people who have high blood pressure readings, are of African descent, or are overweight.  Have your blood pressure checked: ? Every 3-5 years if you are 37-55 years of age. ? Every year if you are 55 years old or older. Diabetes Have regular diabetes screenings. This checks your fasting blood sugar level. Have the screening done:  Once every three years after age 65 if you are at a normal weight and have a low risk for diabetes.  More often and at a younger age if you are overweight or have a high risk for diabetes. What should I know about preventing infection? Hepatitis B If you have a higher risk for hepatitis B, you should be screened for this virus. Talk with your health care provider to find out if you are at risk for hepatitis B infection. Hepatitis C Testing is recommended for:  Everyone born from [birth year] through 1966.  Anyone with known risk factors for hepatitis C. Sexually transmitted infections (STIs)  Get screened  for STIs, including gonorrhea and chlamydia, if: ? You are sexually active and are younger than 71 years of age. ? You are older than 71 years of age and your health care provider tells you that you are at risk for this type of infection. ? Your sexual activity has changed since you were last screened, and you are at increased risk for chlamydia or gonorrhea. Ask your health care provider if you are at risk.  Ask your health care provider about whether you are at high risk for HIV. Your health care provider may recommend a prescription medicine to help prevent HIV infection. If you choose to take medicine to prevent HIV, you should first get tested for HIV. You should then be tested every 3 months for as long as you are taking the medicine. Pregnancy  If you are about to stop having your period (premenopausal) and you may become pregnant, seek counseling before you get pregnant.  Take 400 to 800 micrograms (mcg) of folic  acid every day if you become pregnant.  Ask for birth control (contraception) if you want to prevent pregnancy. Osteoporosis and menopause Osteoporosis is a disease in which the bones lose minerals and strength with aging. This can result in bone fractures. If you are 53 years old or older, or if you are at risk for osteoporosis and fractures, ask your health care provider if you should:  Be screened for bone loss.  Take a calcium or vitamin D supplement to lower your risk of fractures.  Be given hormone replacement therapy (HRT) to treat symptoms of menopause. Follow these instructions at home: Lifestyle  Do not use any products that contain nicotine or tobacco, such as cigarettes, e-cigarettes, and chewing tobacco. If you need help quitting, ask your health care provider.  Do not use street drugs.  Do not share needles.  Ask your health care provider for help if you need support or information about quitting drugs. Alcohol use  Do not drink alcohol if: ? Your  health care provider tells you not to drink. ? You are pregnant, may be pregnant, or are planning to become pregnant.  If you drink alcohol: ? Limit how much you use to 0-1 drink a day. ? Limit intake if you are breastfeeding.  Be aware of how much alcohol is in your drink. In the [place] one drink equals one 12 oz bottle of beer (355 mL), one 5 oz glass of wine (148 mL), or one 1 oz glass of hard liquor (44 mL). General instructions  Schedule regular health, dental, and eye exams.  Stay current with your vaccines.  Tell your health care provider if: ? You often feel depressed. ? You have ever been abused or do not feel safe at home. Summary  Adopting a healthy lifestyle and getting preventive care are important in promoting health and wellness.  Follow your health care provider's instructions about healthy diet, exercising, and getting tested or screened for diseases.  Follow your health care provider's instructions on monitoring your cholesterol and blood pressure. This information is not intended to replace advice given to you by your health care provider. Make sure you discuss any questions you have with your health care provider. Document Revised: 08/15/2018 Document Reviewed: 08/15/2018 Elsevier Patient Education  2021 [place].   Steps to Quit Smoking Smoking tobacco is the leading cause of preventable death. It can affect almost every organ in the body. Smoking puts you and those around you at risk for developing many serious chronic diseases. Quitting smoking can be difficult, but it is one of the best things that you can do for your health. It is never too late to quit. How do I get ready to quit? When you decide to quit smoking, create a plan to help you succeed. Before you quit:  Pick a date to quit. Set a date within the next 2 weeks to give you time to prepare.  Write down the reasons why you are quitting. Keep this list in places where you will see it  often.  Tell your family, friends, and co-workers that you are quitting. Support from your loved ones can make quitting easier.  Talk with your health care provider about your options for quitting smoking.  Find out what treatment options are covered by your health insurance.  Identify people, places, things, and activities that make you want to smoke (triggers). Avoid them. What first steps can I take to quit smoking?  Throw away all cigarettes at home, at  work, and in your car.  Throw away smoking accessories, such as Scientist, research (medical).  Clean your car. Make sure to empty the ashtray.  Clean your home, including curtains and carpets. What strategies can I use to quit smoking? Talk with your health care provider about combining strategies, such as taking medicines while you are also receiving in-person counseling. Using these two strategies together makes you more likely to succeed in quitting than if you used either strategy on its own.  If you are pregnant or breastfeeding, talk with your health care provider about finding counseling or other support strategies to quit smoking. Do not take medicine to help you quit smoking unless your health care provider tells you to do so. To quit smoking: Quit right away  Quit smoking completely, instead of gradually reducing how much you smoke over a period of time. Research shows that stopping smoking right away is more successful than gradually quitting.  Attend in-person counseling to help you build problem-solving skills. You are more likely to succeed in quitting if you attend counseling sessions regularly. Even short sessions of 10 minutes can be effective. Take medicine You may take medicines to help you quit smoking. Some medicines require a prescription and some you can purchase over-the-counter. Medicines may have nicotine in them to replace the nicotine in cigarettes. Medicines may:  Help to stop cravings.  Help to relieve  withdrawal symptoms. Your health care provider may recommend:  Nicotine patches, gum, or lozenges.  Nicotine inhalers or sprays.  Non-nicotine medicine that is taken by mouth. Find resources Find resources and support systems that can help you to quit smoking and remain smoke-free after you quit. These resources are most helpful when you use them often. They include:  Online chats with a Social worker.  Telephone quitlines.  Printed Furniture conservator/restorer.  Support groups or group counseling.  Text messaging programs.  Mobile phone apps or applications. Use apps that can help you stick to your quit plan by providing reminders, tips, and encouragement. There are many free apps for mobile devices as well as websites. Examples include Quit Guide from the [employer] and smokefree.gov   What things can I do to make it easier to quit?  Reach out to your family and friends for support and encouragement. Call telephone quitlines (1-800-QUIT-NOW), reach out to support groups, or work with a counselor for support.  Ask people who smoke to avoid smoking around you.  Avoid places that trigger you to smoke, such as bars, parties, or smoke-break areas at work.  Spend time with people who do not smoke.  Lessen the stress in your life. Stress can be a smoking trigger for some people. To lessen stress, try: ? Exercising regularly. ? Doing deep-breathing exercises. ? Doing yoga. ? Meditating. ? Performing a body scan. This involves closing your eyes, scanning your body from head to toe, and noticing which parts of your body are particularly tense. Try to relax the muscles in those areas.   How will I feel when I quit smoking? Day 1 to 3 weeks Within the first 24 hours of quitting smoking, you may start to feel withdrawal symptoms. These symptoms are usually most noticeable 2-3 days after quitting, but they usually do not last for more than 2-3 weeks. You may experience these symptoms:  Mood  swings.  Restlessness, anxiety, or irritability.  Trouble concentrating.  Dizziness.  Strong cravings for sugary foods and nicotine.  Mild weight gain.  Constipation.  Nausea.  Coughing or a  sore throat.  Changes in how the medicines that you take for unrelated issues work in your body.  Depression.  Trouble sleeping (insomnia). Week 3 and afterward After the first 2-3 weeks of quitting, you may start to notice more positive results, such as:  Improved sense of smell and taste.  Decreased coughing and sore throat.  Slower heart rate.  Lower blood pressure.  Clearer skin.  The ability to breathe more easily.  Fewer sick days. Quitting smoking can be very challenging. Do not get discouraged if you are not successful the first time. Some people need to make many attempts to quit before they achieve long-term success. Do your best to stick to your quit plan, and talk with your health care provider if you have any questions or concerns. Summary  Smoking tobacco is the leading cause of preventable death. Quitting smoking is one of the best things that you can do for your health.  When you decide to quit smoking, create a plan to help you succeed.  Quit smoking right away, not slowly over a period of time.  When you start quitting, seek help from your health care provider, family, or friends. This information is not intended to replace advice given to you by your health care provider. Make sure you discuss any questions you have with your health care provider. Document Revised: 05/17/2019 Document Reviewed: 11/10/2018 Elsevier Patient Education  [employer].

## 2021-04-07 ENCOUNTER — Ambulatory Visit (INDEPENDENT_AMBULATORY_CARE_PROVIDER_SITE_OTHER): Payer: Medicare Other

## 2021-04-07 ENCOUNTER — Other Ambulatory Visit: Payer: Self-pay

## 2021-04-07 DIAGNOSIS — M8588 Other specified disorders of bone density and structure, other site: Secondary | ICD-10-CM | POA: Diagnosis not present

## 2021-04-07 DIAGNOSIS — M8589 Other specified disorders of bone density and structure, multiple sites: Secondary | ICD-10-CM | POA: Diagnosis not present

## 2021-04-07 DIAGNOSIS — Z0000 Encounter for general adult medical examination without abnormal findings: Secondary | ICD-10-CM

## 2021-04-07 DIAGNOSIS — Z780 Asymptomatic menopausal state: Secondary | ICD-10-CM | POA: Diagnosis not present

## 2021-04-08 ENCOUNTER — Encounter: Payer: Self-pay | Admitting: Physician Assistant

## 2021-04-08 NOTE — Progress Notes (Signed)
2019 Tscore -.7 and now -2.0. you are still in osteopenia but in 3 years bones have worsened quite a bit. How much calcium and vitamin D are you taking? I would strongly consider smoking cessation. Make sure you are exercise and low impact workouts. Recheck in 2 years.

## 2021-04-30 DIAGNOSIS — Z23 Encounter for immunization: Secondary | ICD-10-CM | POA: Diagnosis not present

## 2021-06-11 ENCOUNTER — Other Ambulatory Visit: Payer: Self-pay

## 2021-06-11 ENCOUNTER — Encounter: Payer: Self-pay | Admitting: Physician Assistant

## 2021-06-11 ENCOUNTER — Encounter: Payer: Self-pay | Admitting: Emergency Medicine

## 2021-06-11 ENCOUNTER — Emergency Department (INDEPENDENT_AMBULATORY_CARE_PROVIDER_SITE_OTHER)
Admission: EM | Admit: 2021-06-11 | Discharge: 2021-06-11 | Disposition: A | Discharge status: Home / Self Care | Payer: Medicare Other | Source: Home / Self Care

## 2021-06-11 DIAGNOSIS — R52 Pain, unspecified: Secondary | ICD-10-CM | POA: Diagnosis not present

## 2021-06-11 DIAGNOSIS — R059 Cough, unspecified: Secondary | ICD-10-CM

## 2021-06-11 DIAGNOSIS — U071 COVID-19: Secondary | ICD-10-CM

## 2021-06-11 MED ORDER — MOLNUPIRAVIR EUA 200MG CAPSULE: 4.0000 | ORAL_CAPSULE | Freq: Two times a day (BID) | ORAL | 0 refills | Status: AC

## 2021-06-11 MED ORDER — BENZONATATE 200 MG PO CAPS: 200.0000 mg | ORAL_CAPSULE | Freq: Three times a day (TID) | ORAL | 0 refills | Status: AC | PRN

## 2021-06-11 NOTE — ED Triage Notes (Signed)
Pt c/o cough and body aches x2 days. States she had positive ovid test at home today. She has never had covid before and has received covid booster vacccine

## 2021-06-11 NOTE — ED Provider Notes (Signed)
[facility]    CSN: [id number] Arrival date & time: 06/11/21  1531      History   Chief Complaint Chief Complaint  Patient presents with   Cough    Pt c/o cough and body aches x2 days. States she has not had a fever. Took at home covid test today and was positive. Has had all covid boosters     HPI Leslie Oconnell is a 70 y.o. female.   HPI Pleasant 70 year old female presents with cough and body aches x 2 days.  Patient reports positive home COVID-19 test today.  Patient is vaccinated for COVID-19 and has had all COVID-19 boosters.  PMH significant for osteopenia, current smoker, hyperlipidemia.  Patient reports currently smokes 10 cigarettes/day and reports 51-year pack history.  Past Medical History:  Diagnosis Date   Hyperlipidemia     Patient Active Problem List   Diagnosis Date Noted   Osteopenia 12/25/2013   Current smoker 11/27/2013   Hyperlipidemia 11/26/2012    Past Surgical History:  Procedure Laterality Date   HERNIA REPAIR  1952   TONSILECTOMY/ADENOIDECTOMY WITH MYRINGOTOMY  1964    OB History   No obstetric history on file.      Home Medications    Prior to Admission medications   Medication Sig Start Date End Date Taking? Authorizing Provider  benzonatate (TESSALON) 200 MG capsule Take 1 capsule (200 mg total) by mouth 3 (three) times daily as needed for up to 7 days for cough. 06/11/21 06/18/21 Yes Eliezer Lofts, FNP  molnupiravir EUA (LAGEVRIO) 200 mg CAPS capsule Take 4 capsules (800 mg total) by mouth 2 (two) times daily for 5 days. 06/11/21 06/16/21 Yes Eliezer Lofts, FNP  aspirin 81 MG tablet Take 81 mg by mouth daily.    [provider]  atorvastatin (LIPITOR) 10 MG tablet TAKE 1 TABLET BY MOUTH EVERY DAY/ LABS FOR REFILLS 12/30/20   Iran Planas L, PA-C  calcium carbonate (OS-CAL) 600 MG TABS Take 600 mg by mouth daily.    [provider]  CINNAMON PO Take 2,000 mg by mouth daily.    [provider]   Collagen Hydrolysate, Bovine, POWD by Does not apply route.    [provider]  fish oil-omega-3 fatty acids 1000 MG capsule Take 2 g by mouth daily.    [provider]  Red Yeast Rice 600 MG CAPS Take 1,200 mg by mouth daily.    [provider]  sodium chloride (OCEAN) 0.65 % SOLN nasal spray Place 1 spray into both nostrils as needed. 04/18/15   Noe Gens, PA-C    Family History Family History  Problem Relation Age of Onset   Cancer Mother    Alzheimer's disease Father    Breast cancer Maternal Aunt    Breast cancer Maternal Aunt     Social History Social History   Tobacco Use   Smoking status: Every Day    Packs/day: 0.50    Years: 50.00    Pack years: 25.00    Types: Cigarettes   Smokeless tobacco: Never  Vaping Use   Vaping Use: Never used  Substance Use Topics   Alcohol use: Yes    Comment: 2-3 glasses on weekends   Drug use: No     Allergies   Patient has no known allergies.   Review of Systems Review of Systems  Respiratory:  Positive for cough.   Musculoskeletal:  Positive for myalgias.  All other systems reviewed and are negative.  Physical Exam Triage Vital Signs ED Triage Vitals  Enc Vitals Group     BP      Pulse      Resp      Temp      Temp src      SpO2      Weight      Height      Head Circumference      Peak Flow      Pain Score      Pain Loc      Pain Edu?      Excl. in [place]?    No data found.  Updated Vital Signs BP 116/83 (BP Location: Right Arm)   Pulse 90   Temp 98.7 F (37.1 C) (Oral)   SpO2 97%      Physical Exam Vitals and nursing note reviewed.  Constitutional:      General: She is not in acute distress.    Appearance: Normal appearance. She is ill-appearing.  HENT:     Head: Normocephalic and atraumatic.     Right Ear: Tympanic membrane, ear canal and external ear normal.     Left Ear: Tympanic membrane, ear canal and external ear normal.     Mouth/Throat:     Mouth: Mucous  membranes are moist.     Pharynx: Oropharynx is clear.  Eyes:     Extraocular Movements: Extraocular movements intact.     Conjunctiva/sclera: Conjunctivae normal.     Pupils: Pupils are equal, round, and reactive to light.  Cardiovascular:     Rate and Rhythm: Normal rate and regular rhythm.     Pulses: Normal pulses.     Heart sounds: Normal heart sounds.  Pulmonary:     Effort: Pulmonary effort is normal.     Breath sounds: Normal breath sounds.     Comments: No adventitious breath sounds noted Musculoskeletal:        General: Normal range of motion.     Cervical back: Normal range of motion and neck supple. No tenderness.  Lymphadenopathy:     Cervical: No cervical adenopathy.  Skin:    General: Skin is warm and dry.  Neurological:     General: No focal deficit present.     Mental Status: She is alert and oriented to person, place, and time. Mental status is at baseline.  Psychiatric:        Mood and Affect: Mood normal.        Behavior: Behavior normal.        Thought Content: Thought content normal.     UC Treatments / Results  Labs (all labs ordered are listed, but only abnormal results are displayed) Labs Reviewed - No data to display  EKG   Radiology No results found.  Procedures Procedures (including critical care time)  Medications Ordered in UC Medications - No data to display  Initial Impression / Assessment and Plan / UC Course  I have reviewed the triage vital signs and the nursing notes.  Pertinent labs & imaging results that were available during my care of the patient were reviewed by me and considered in my medical decision making (see chart for details).     MDM: 1.  COVID-19-Rx'd Molnupiravir; 2. Cough-Rx'd Tessalon perles; 3.  Generalized body aches-Rx'd Molnupiravir. Advised patient to take medication as directed with food to completion.  Advised patient may take Tessalon Perles daily, as needed for cough.  Encouraged patient increase  daily water intake while taking this medication.  Patient discharged home, hemodynamically stable. Final Clinical Impressions(s) / UC Diagnoses   Final diagnoses:  Cough, unspecified type  COVID-19  Generalized body aches     Discharge Instructions      Advised patient to take medication as directed with food to completion.  Advised patient may take Tessalon Perles daily, as needed for cough.  Encouraged patient increase daily water intake while taking this medication.     ED Prescriptions     Medication Sig Dispense Auth. Provider   molnupiravir EUA (LAGEVRIO) 200 mg CAPS capsule Take 4 capsules (800 mg total) by mouth 2 (two) times daily for 5 days. 40 capsule Eliezer Lofts, FNP   benzonatate (TESSALON) 200 MG capsule Take 1 capsule (200 mg total) by mouth 3 (three) times daily as needed for up to 7 days for cough. 30 capsule Eliezer Lofts, FNP      PDMP not reviewed this encounter.   Eliezer Lofts, [place] 06/11/21 1626

## 2021-06-11 NOTE — Discharge Instructions (Addendum)
Advised patient to take medication as directed with food to completion.  Advised patient may take Tessalon Perles daily, as needed for cough.  Encouraged patient increase daily water intake while taking this medication.

## 2021-06-29 ENCOUNTER — Encounter: Payer: Self-pay | Admitting: Physician Assistant

## 2021-10-01 DIAGNOSIS — H02831 Dermatochalasis of right upper eyelid: Secondary | ICD-10-CM | POA: Diagnosis not present

## 2021-10-01 DIAGNOSIS — H11153 Pinguecula, bilateral: Secondary | ICD-10-CM | POA: Diagnosis not present

## 2021-10-01 DIAGNOSIS — H0100B Unspecified blepharitis left eye, upper and lower eyelids: Secondary | ICD-10-CM | POA: Diagnosis not present

## 2021-10-01 DIAGNOSIS — H02834 Dermatochalasis of left upper eyelid: Secondary | ICD-10-CM | POA: Diagnosis not present

## 2021-10-01 DIAGNOSIS — H0100A Unspecified blepharitis right eye, upper and lower eyelids: Secondary | ICD-10-CM | POA: Diagnosis not present

## 2021-10-01 DIAGNOSIS — H43813 Vitreous degeneration, bilateral: Secondary | ICD-10-CM | POA: Diagnosis not present

## 2021-10-01 DIAGNOSIS — H25813 Combined forms of age-related cataract, bilateral: Secondary | ICD-10-CM | POA: Diagnosis not present

## 2021-10-07 DIAGNOSIS — Z20822 Contact with and (suspected) exposure to covid-19: Secondary | ICD-10-CM | POA: Diagnosis not present

## 2021-12-04 DIAGNOSIS — Z20822 Contact with and (suspected) exposure to covid-19: Secondary | ICD-10-CM | POA: Diagnosis not present

## 2021-12-27 ENCOUNTER — Other Ambulatory Visit: Payer: Self-pay | Admitting: Physician Assistant

## 2021-12-27 DIAGNOSIS — Z1231 Encounter for screening mammogram for malignant neoplasm of breast: Secondary | ICD-10-CM

## 2022-01-05 DIAGNOSIS — H35372 Puckering of macula, left eye: Secondary | ICD-10-CM | POA: Diagnosis not present

## 2022-01-05 DIAGNOSIS — H52203 Unspecified astigmatism, bilateral: Secondary | ICD-10-CM | POA: Diagnosis not present

## 2022-01-05 DIAGNOSIS — H25813 Combined forms of age-related cataract, bilateral: Secondary | ICD-10-CM | POA: Diagnosis not present

## 2022-01-09 ENCOUNTER — Encounter: Payer: Self-pay | Admitting: Physician Assistant

## 2022-01-10 ENCOUNTER — Encounter: Payer: Self-pay | Admitting: Physician Assistant

## 2022-01-18 DIAGNOSIS — E785 Hyperlipidemia, unspecified: Secondary | ICD-10-CM | POA: Diagnosis not present

## 2022-01-18 DIAGNOSIS — H2511 Age-related nuclear cataract, right eye: Secondary | ICD-10-CM | POA: Diagnosis not present

## 2022-01-18 DIAGNOSIS — F17210 Nicotine dependence, cigarettes, uncomplicated: Secondary | ICD-10-CM | POA: Diagnosis not present

## 2022-01-18 DIAGNOSIS — H25811 Combined forms of age-related cataract, right eye: Secondary | ICD-10-CM | POA: Diagnosis not present

## 2022-01-18 DIAGNOSIS — F17200 Nicotine dependence, unspecified, uncomplicated: Secondary | ICD-10-CM | POA: Diagnosis not present

## 2022-01-18 DIAGNOSIS — Z79899 Other long term (current) drug therapy: Secondary | ICD-10-CM | POA: Diagnosis not present

## 2022-02-17 ENCOUNTER — Ambulatory Visit (INDEPENDENT_AMBULATORY_CARE_PROVIDER_SITE_OTHER): Payer: Medicare Other

## 2022-02-17 DIAGNOSIS — Z1231 Encounter for screening mammogram for malignant neoplasm of breast: Secondary | ICD-10-CM | POA: Diagnosis not present

## 2022-02-18 NOTE — Progress Notes (Signed)
Normal mammogram. Follow up in 1 year.

## 2022-02-22 DIAGNOSIS — Z961 Presence of intraocular lens: Secondary | ICD-10-CM | POA: Diagnosis not present

## 2022-02-22 DIAGNOSIS — Z9841 Cataract extraction status, right eye: Secondary | ICD-10-CM | POA: Diagnosis not present

## 2022-02-22 DIAGNOSIS — F17200 Nicotine dependence, unspecified, uncomplicated: Secondary | ICD-10-CM | POA: Diagnosis not present

## 2022-02-22 DIAGNOSIS — Z7982 Long term (current) use of aspirin: Secondary | ICD-10-CM | POA: Diagnosis not present

## 2022-02-22 DIAGNOSIS — Z85828 Personal history of other malignant neoplasm of skin: Secondary | ICD-10-CM | POA: Diagnosis not present

## 2022-02-22 DIAGNOSIS — E785 Hyperlipidemia, unspecified: Secondary | ICD-10-CM | POA: Diagnosis not present

## 2022-02-22 DIAGNOSIS — Z79899 Other long term (current) drug therapy: Secondary | ICD-10-CM | POA: Diagnosis not present

## 2022-02-22 DIAGNOSIS — H25812 Combined forms of age-related cataract, left eye: Secondary | ICD-10-CM | POA: Diagnosis not present

## 2022-03-14 ENCOUNTER — Other Ambulatory Visit: Payer: Self-pay | Admitting: Neurology

## 2022-03-14 DIAGNOSIS — Z131 Encounter for screening for diabetes mellitus: Secondary | ICD-10-CM

## 2022-03-14 DIAGNOSIS — E782 Mixed hyperlipidemia: Secondary | ICD-10-CM

## 2022-03-14 DIAGNOSIS — Z79899 Other long term (current) drug therapy: Secondary | ICD-10-CM

## 2022-03-14 DIAGNOSIS — Z1329 Encounter for screening for other suspected endocrine disorder: Secondary | ICD-10-CM

## 2022-03-17 DIAGNOSIS — Z1329 Encounter for screening for other suspected endocrine disorder: Secondary | ICD-10-CM | POA: Diagnosis not present

## 2022-03-17 DIAGNOSIS — Z79899 Other long term (current) drug therapy: Secondary | ICD-10-CM | POA: Diagnosis not present

## 2022-03-17 DIAGNOSIS — Z131 Encounter for screening for diabetes mellitus: Secondary | ICD-10-CM | POA: Diagnosis not present

## 2022-03-17 DIAGNOSIS — E782 Mixed hyperlipidemia: Secondary | ICD-10-CM | POA: Diagnosis not present

## 2022-03-18 ENCOUNTER — Encounter: Payer: Self-pay | Admitting: Physician Assistant

## 2022-03-18 ENCOUNTER — Other Ambulatory Visit: Payer: Self-pay

## 2022-03-18 DIAGNOSIS — R718 Other abnormality of red blood cells: Secondary | ICD-10-CM | POA: Insufficient documentation

## 2022-03-18 LAB — CBC WITH DIFFERENTIAL/PLATELET
Absolute Monocytes: 319 cells/uL (ref 200–950)
Basophils Absolute: 49 cells/uL (ref 0–200)
Basophils Relative: 1 %
Eosinophils Absolute: 127 cells/uL (ref 15–500)
Eosinophils Relative: 2.6 %
HCT: 47.4 % — ABNORMAL HIGH (ref 35.0–45.0)
Hemoglobin: 15.4 g/dL (ref 11.7–15.5)
Lymphs Abs: 1695 cells/uL (ref 850–3900)
MCH: 32.4 pg (ref 27.0–33.0)
MCHC: 32.5 g/dL (ref 32.0–36.0)
MCV: 99.6 fL (ref 80.0–100.0)
MPV: 9.6 fL (ref 7.5–12.5)
Monocytes Relative: 6.5 %
Neutro Abs: 2710 cells/uL (ref 1500–7800)
Neutrophils Relative %: 55.3 %
Platelets: 241 10*3/uL (ref 140–400)
RBC: 4.76 10*6/uL (ref 3.80–5.10)
RDW: 12.7 % (ref 11.0–15.0)
Total Lymphocyte: 34.6 %
WBC: 4.9 10*3/uL (ref 3.8–10.8)

## 2022-03-18 LAB — LIPID PANEL W/REFLEX DIRECT LDL
Cholesterol: 148 mg/dL (ref <200)
HDL: 64 mg/dL (ref >=50)
LDL Cholesterol (Calc): 67 mg/dL (calc)
Non-HDL Cholesterol (Calc): 84 mg/dL (calc) (ref <130)
Total CHOL/HDL Ratio: 2.3 (calc) (ref <5.0)
Triglycerides: 83 mg/dL (ref <150)

## 2022-03-18 LAB — COMPLETE METABOLIC PANEL WITH GFR
AG Ratio: 2 (calc) (ref 1.0–2.5)
ALT: 9 U/L (ref 6–29)
AST: 13 U/L (ref 10–35)
Albumin: 4.4 g/dL (ref 3.6–5.1)
Alkaline phosphatase (APISO): 43 U/L (ref 37–153)
BUN: 13 mg/dL (ref 7–25)
CO2: 29 mmol/L (ref 20–32)
Calcium: 9.4 mg/dL (ref 8.6–10.4)
Chloride: 105 mmol/L (ref 98–110)
Creat: 0.75 mg/dL (ref 0.60–1.00)
Globulin: 2.2 g/dL (calc) (ref 1.9–3.7)
Glucose, Bld: 87 mg/dL (ref 65–99)
Potassium: 4.7 mmol/L (ref 3.5–5.3)
Sodium: 141 mmol/L (ref 135–146)
Total Bilirubin: 0.5 mg/dL (ref 0.2–1.2)
Total Protein: 6.6 g/dL (ref 6.1–8.1)
eGFR: 85 mL/min/{1.73_m2} (ref >=60)

## 2022-03-18 LAB — TSH: TSH: 1.28 mIU/L (ref 0.40–4.50)

## 2022-03-18 MED ORDER — ATORVASTATIN CALCIUM 10 MG PO TABS: ORAL_TABLET | ORAL | 0 refills | Status: DC

## 2022-03-18 NOTE — Progress Notes (Signed)
Leslie Oconnell,   WBC looks good.  Hematocrit is up some could be due to smoking. Hemoglobin and RBC in normal range.  Thyroid normal.  Cholesterol looks great.  Kidney, liver, glucose looks great.

## 2022-03-20 ENCOUNTER — Other Ambulatory Visit: Payer: Self-pay | Admitting: Physician Assistant

## 2022-04-27 ENCOUNTER — Encounter: Payer: Self-pay | Admitting: General Practice

## 2022-05-20 DIAGNOSIS — Z23 Encounter for immunization: Secondary | ICD-10-CM | POA: Diagnosis not present

## 2022-06-14 ENCOUNTER — Other Ambulatory Visit: Payer: Self-pay | Admitting: Physician Assistant

## 2022-06-20 DIAGNOSIS — Z23 Encounter for immunization: Secondary | ICD-10-CM | POA: Diagnosis not present

## 2022-06-24 ENCOUNTER — Encounter: Payer: Self-pay | Admitting: Physician Assistant

## 2022-08-09 ENCOUNTER — Encounter: Payer: Self-pay | Admitting: Family Medicine

## 2022-08-09 ENCOUNTER — Telehealth (INDEPENDENT_AMBULATORY_CARE_PROVIDER_SITE_OTHER): Payer: Medicare Other | Admitting: Family Medicine

## 2022-08-09 VITALS — BP 126/73 | Ht 61.0 in | Wt 117.0 lb

## 2022-08-09 DIAGNOSIS — J0110 Acute frontal sinusitis, unspecified: Secondary | ICD-10-CM | POA: Diagnosis not present

## 2022-08-09 MED ORDER — AMOXICILLIN-POT CLAVULANATE 875-125 MG PO TABS: 1.0000 | ORAL_TABLET | Freq: Two times a day (BID) | ORAL | 0 refills | Status: DC

## 2022-08-09 NOTE — Progress Notes (Signed)
Pt reports that she has sinus problems all the time but the past 2 weeks they have gotten worse. She uses her Navage which usually helps her did not. She stated that she has really had a lot of gross stuff come out of her nose in the past week.   She has had some chills. Her temp this morning was 96. No fevers.   Her ears have been bothering her and she has been taking lipo-flavanoid  She can feel "gushing" in her ears.   She also has had a sinus headache and watery eyes and has been taking aspirin.    Sore throat mostly at night that she contributes to the sinus drainage.

## 2022-08-09 NOTE — Progress Notes (Addendum)
Virtual Visit via Telephone Note  I connected with Leslie Oconnell on 08/09/22 at  1:00 PM EST by telephone and verified that I am speaking with the correct person using two identifiers.   I discussed the limitations, risks, security and privacy concerns of performing an evaluation and management service by telephone and the availability of in person appointments. I also discussed with the patient that there may be a patient responsible charge related to this service. The patient expressed understanding and agreed to proceed.  Patient location: at home Provider loccation: In office   Subjective:    CC:   Chief Complaint  Patient presents with   Cough   Sinusitis    HPI:  Pt reports that she has sinus problems all the time but the past 2 weeks they have gotten worse. She uses her Navage which usually helps her did not. She stated that she has really had a lot of gross stuff come out of her nose in the past week. Pian in her face over her forehead and under eys. She has had some chills. Her temp this morning was 96. No fevers.  Mucous has been yellow and green.  + post nasal drip and causes some cough and nausea.  Her ears have been bothering her and she has been taking lipo-flavanoid. She can feel "gushing" in her ears. Intermittent ST.    She also has had a sinus headache and watery eyes and has been taking aspirin. No history of sinus surgery.    Sore throat mostly at night that she contributes to the sinus drainage.  No OTC medications.      Past medical history, Surgical history, Family history not pertinant except as noted below, Social history, Allergies, and medications have been entered into the medical record, reviewed, and corrections made.   Review of Systems: No fevers, chills, night sweats, weight loss, chest pain, or shortness of breath.   Objective:    General: Speaking clearly in complete sentences without any shortness of breath.  Alert and oriented x3.  Normal  judgment. No apparent acute distress.    Impression and Recommendations:    Problem List Items Addressed This Visit   None Visit Diagnoses     Acute non-recurrent frontal sinusitis    -  Primary   Relevant Medications   amoxicillin-clavulanate (AUGMENTIN) 875-125 MG tablet     Acute sinusitis-symptoms x2 weeks at this point with no improvement or relief.  We will go ahead and treat with Augmentin.  Call if not better or worsening or new symptoms.  Continue hydrating well.  Recommend a trial of Flonase or Nasonex in addition to her sinus lavage.   Did encourage her to also schedule her Medicare wellness exam.  She is helping take care of her grandkids right now also wants to schedule after the first of the year.  So we will get that done. Meds ordered this encounter  Medications   amoxicillin-clavulanate (AUGMENTIN) 875-125 MG tablet    Sig: Take 1 tablet by mouth 2 (two) times daily.    Dispense:  14 tablet    Refill:  0    Meds ordered this encounter  Medications   amoxicillin-clavulanate (AUGMENTIN) 875-125 MG tablet    Sig: Take 1 tablet by mouth 2 (two) times daily.    Dispense:  14 tablet    Refill:  0     I discussed the assessment and treatment plan with the patient. The patient was provided an opportunity to ask  questions and all were answered. The patient agreed with the plan and demonstrated an understanding of the instructions.   The patient was advised to call back or seek an in-person evaluation if the symptoms worsen or if the condition fails to improve as anticipated.  I provided 15 minutes of non-face-to-face time during this encounter.   Leslie Lecher, MD

## 2022-08-09 NOTE — Patient Instructions (Signed)
We would also like to get you scheduled for a Medicare wellness exam.  This can be done here with Novella Rob our Medicare wellness nurse or over the phone.  Please schedule at your convenience.  Care includes this as part of your benefits yearly and it is FREE!

## 2022-08-23 IMAGING — MG MM DIGITAL SCREENING BILAT W/ TOMO AND CAD
6 of 10 series · 6 of 30 positions shown · non-contrast
Comparison: Previous exam(s).

CLINICAL DATA: Screening.

EXAM:
DIGITAL SCREENING BILATERAL MAMMOGRAM WITH TOMOSYNTHESIS AND CAD
TECHNIQUE: Bilateral screening digital craniocaudal and mediolateral oblique
mammograms were obtained. Bilateral screening digital breast
tomosynthesis was performed. The images were evaluated with
computer-aided detection.

[R MLO synth-2D]
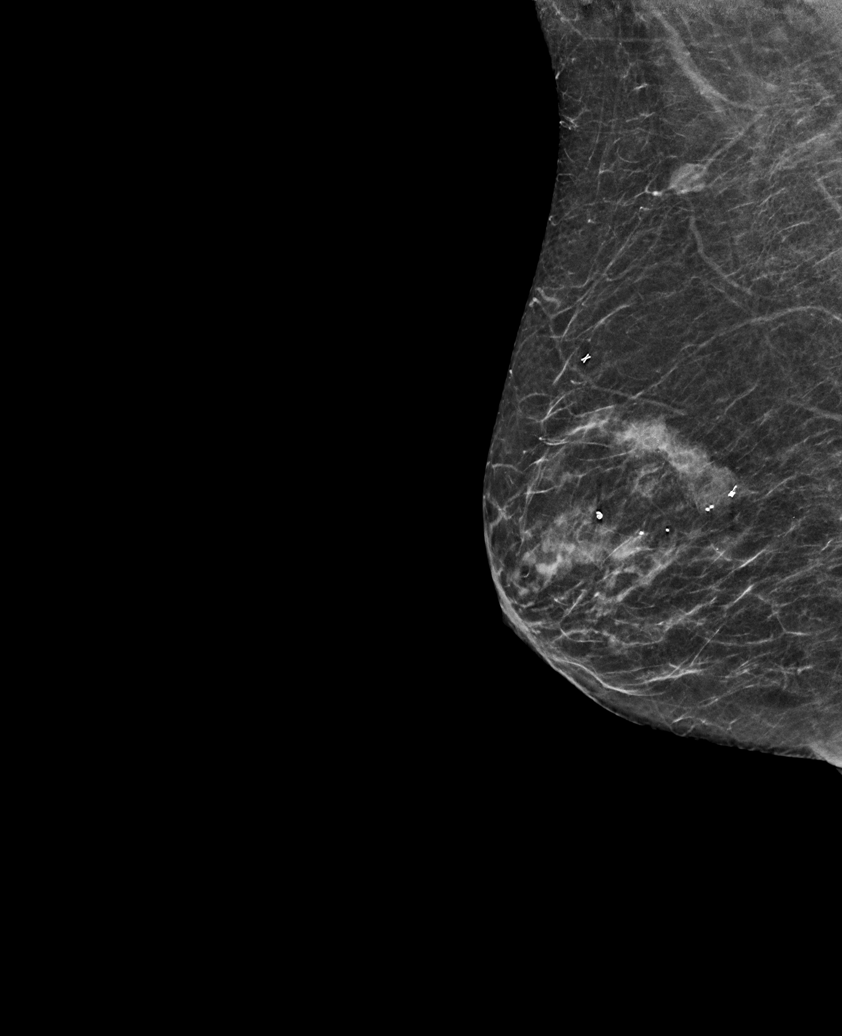

[L CC synth-2D]
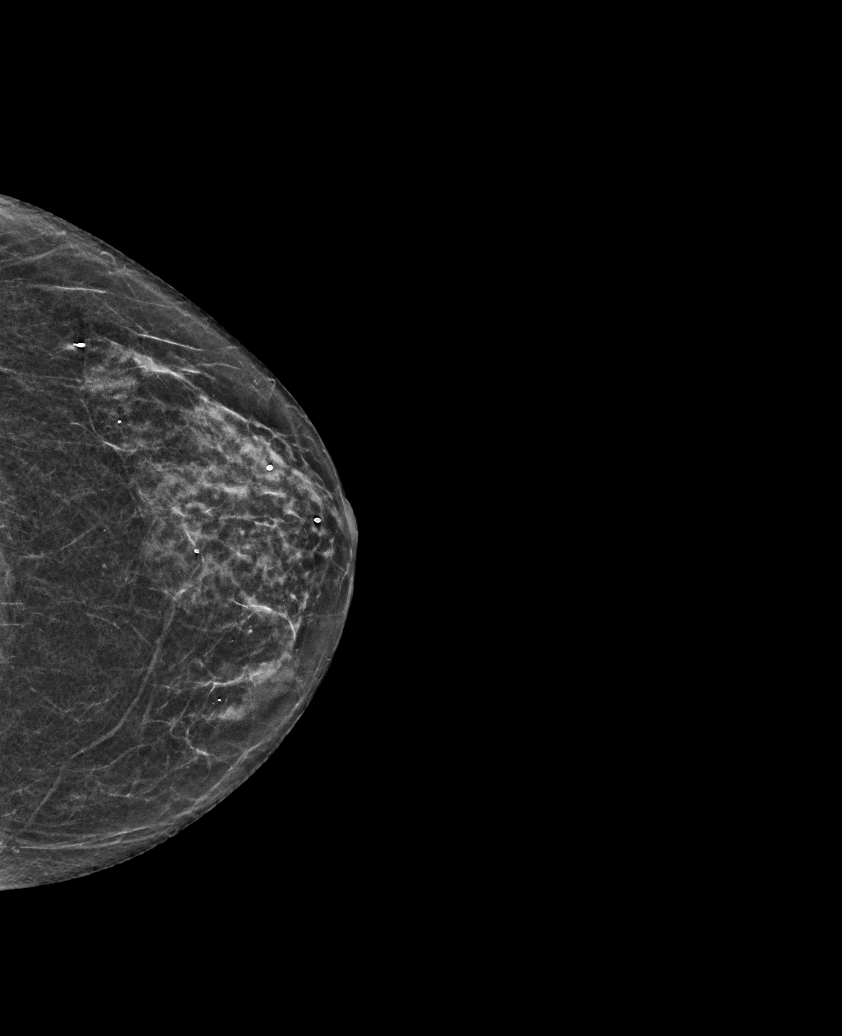

[L MLO synth-2D]
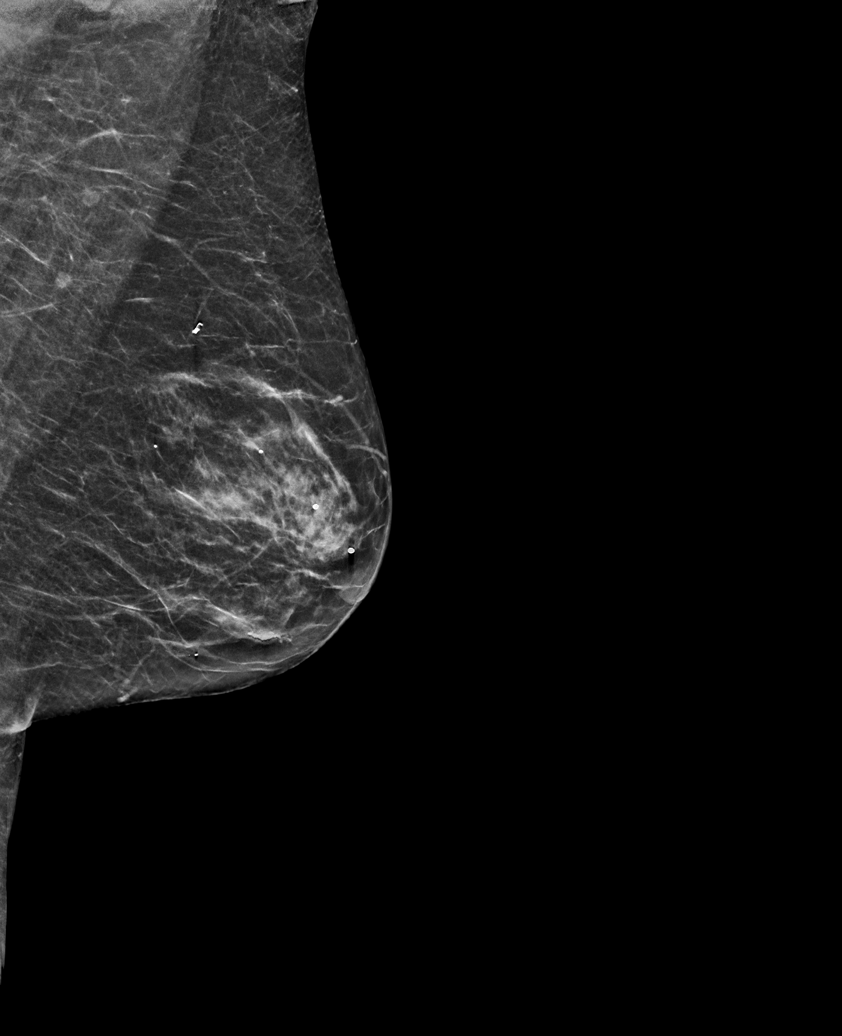

[R XCCL synth-2D]
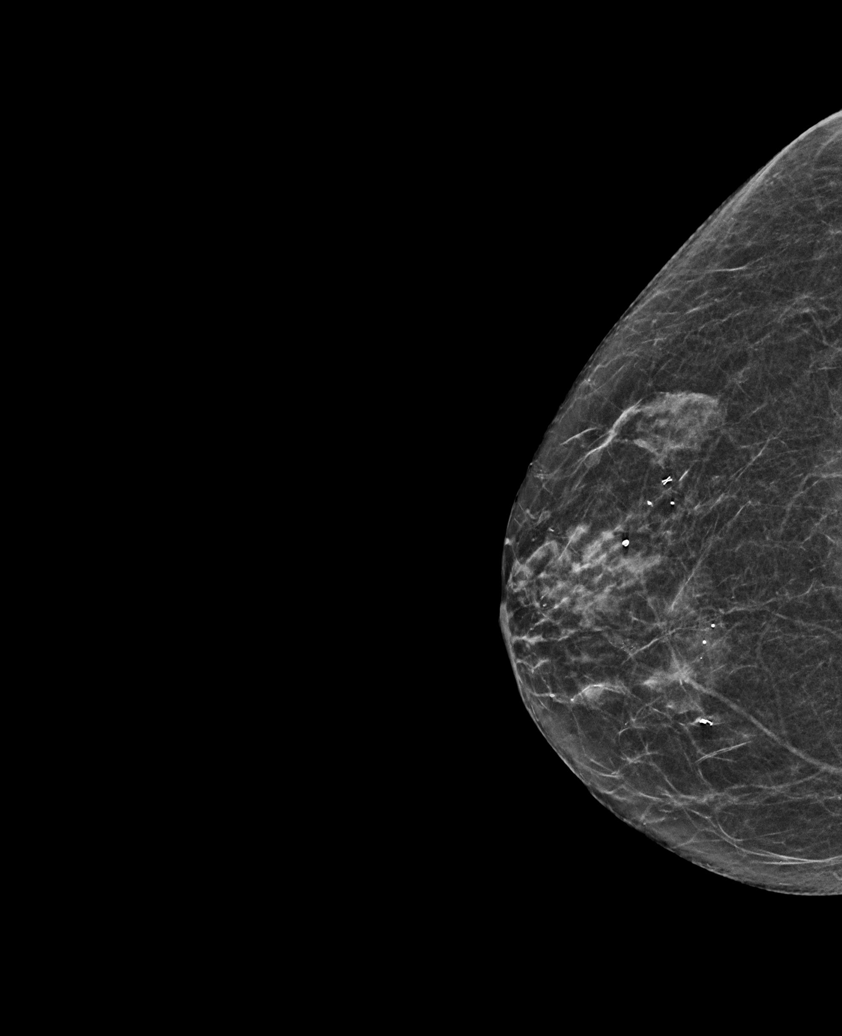

[R CC synth-2D]
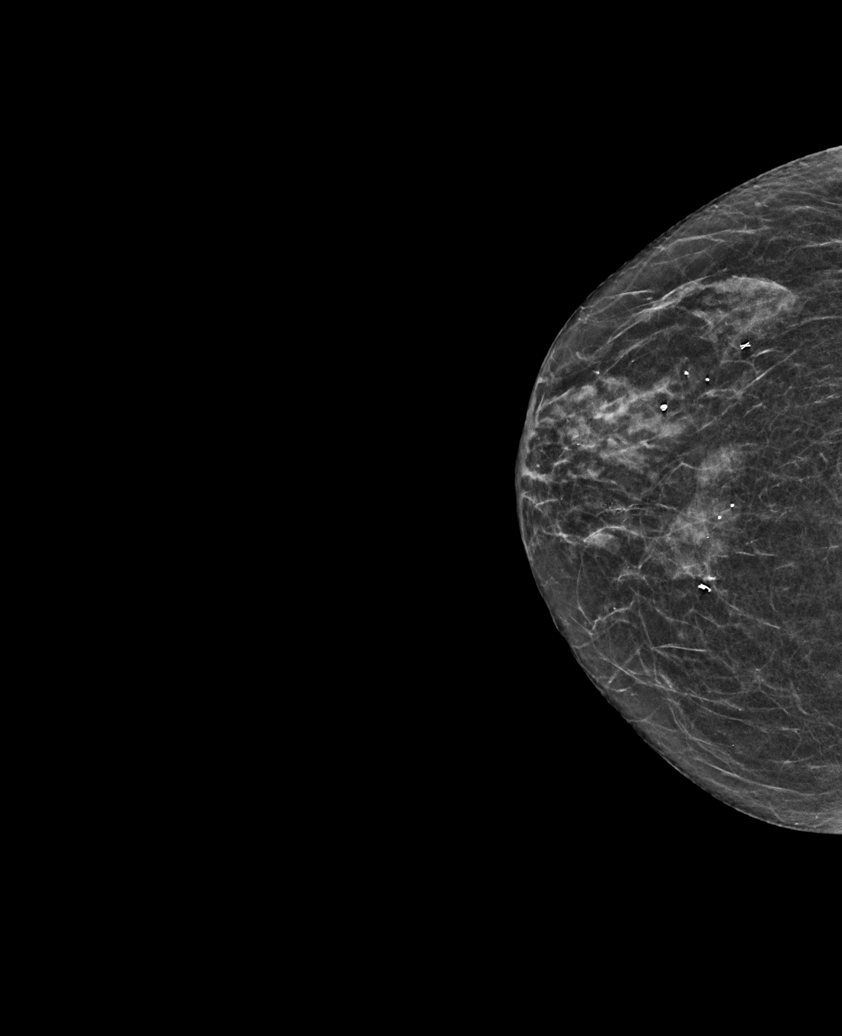

[L CC tomo · tomo slice 25/48.0]
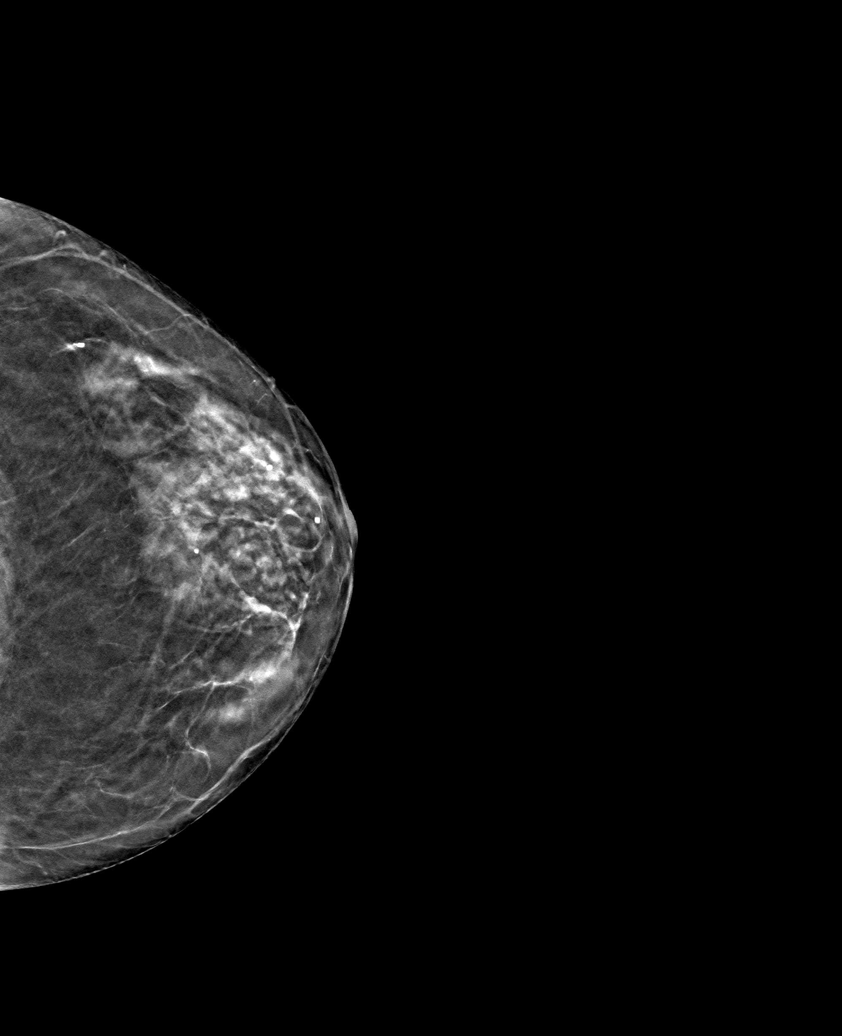

[6 of 30 positions shown; findings below may reference images not displayed]

ACR Breast Density Category c: The breast tissue is heterogeneously
dense, which may obscure small masses.
FINDINGS: There are no findings suspicious for malignancy.
IMPRESSION: No mammographic evidence of malignancy. A result letter of this
screening mammogram will be mailed directly to the patient.

RECOMMENDATION:
Screening mammogram in one year. (Code:[id number])

BI-RADS CATEGORY  1: Negative.

## 2022-09-08 ENCOUNTER — Other Ambulatory Visit: Payer: Self-pay

## 2022-09-08 ENCOUNTER — Other Ambulatory Visit: Payer: Self-pay | Admitting: Family Medicine

## 2022-10-02 ENCOUNTER — Other Ambulatory Visit: Payer: Self-pay | Admitting: Physician Assistant

## 2023-02-13 ENCOUNTER — Other Ambulatory Visit: Payer: Self-pay | Admitting: Physician Assistant

## 2023-02-13 DIAGNOSIS — Z1231 Encounter for screening mammogram for malignant neoplasm of breast: Secondary | ICD-10-CM

## 2023-02-21 ENCOUNTER — Ambulatory Visit (INDEPENDENT_AMBULATORY_CARE_PROVIDER_SITE_OTHER): Payer: Medicare Other

## 2023-02-21 DIAGNOSIS — Z1231 Encounter for screening mammogram for malignant neoplasm of breast: Secondary | ICD-10-CM

## 2023-02-24 NOTE — Progress Notes (Signed)
Normal mammogram. Follow up in 1 year.

## 2023-03-31 DIAGNOSIS — H02831 Dermatochalasis of right upper eyelid: Secondary | ICD-10-CM | POA: Diagnosis not present

## 2023-03-31 DIAGNOSIS — H35373 Puckering of macula, bilateral: Secondary | ICD-10-CM | POA: Diagnosis not present

## 2023-03-31 DIAGNOSIS — Z961 Presence of intraocular lens: Secondary | ICD-10-CM | POA: Diagnosis not present

## 2023-03-31 DIAGNOSIS — H04123 Dry eye syndrome of bilateral lacrimal glands: Secondary | ICD-10-CM | POA: Diagnosis not present

## 2023-03-31 DIAGNOSIS — H26493 Other secondary cataract, bilateral: Secondary | ICD-10-CM | POA: Diagnosis not present

## 2023-03-31 DIAGNOSIS — H02834 Dermatochalasis of left upper eyelid: Secondary | ICD-10-CM | POA: Diagnosis not present

## 2023-03-31 DIAGNOSIS — H11153 Pinguecula, bilateral: Secondary | ICD-10-CM | POA: Diagnosis not present

## 2023-05-02 ENCOUNTER — Encounter: Payer: Self-pay | Admitting: Physician Assistant

## 2023-05-03 ENCOUNTER — Ambulatory Visit (INDEPENDENT_AMBULATORY_CARE_PROVIDER_SITE_OTHER): Payer: Medicare Other | Admitting: Physician Assistant

## 2023-05-03 ENCOUNTER — Encounter: Payer: Self-pay | Admitting: Physician Assistant

## 2023-05-03 VITALS — BP 126/65 | HR 94 | Ht 61.0 in | Wt 113.0 lb

## 2023-05-03 DIAGNOSIS — Z122 Encounter for screening for malignant neoplasm of respiratory organs: Secondary | ICD-10-CM

## 2023-05-03 DIAGNOSIS — Z1329 Encounter for screening for other suspected endocrine disorder: Secondary | ICD-10-CM | POA: Diagnosis not present

## 2023-05-03 DIAGNOSIS — J320 Chronic maxillary sinusitis: Secondary | ICD-10-CM | POA: Diagnosis not present

## 2023-05-03 DIAGNOSIS — Z79899 Other long term (current) drug therapy: Secondary | ICD-10-CM

## 2023-05-03 DIAGNOSIS — E782 Mixed hyperlipidemia: Secondary | ICD-10-CM | POA: Diagnosis not present

## 2023-05-03 DIAGNOSIS — M8580 Other specified disorders of bone density and structure, unspecified site: Secondary | ICD-10-CM

## 2023-05-03 DIAGNOSIS — F17200 Nicotine dependence, unspecified, uncomplicated: Secondary | ICD-10-CM

## 2023-05-03 DIAGNOSIS — Z131 Encounter for screening for diabetes mellitus: Secondary | ICD-10-CM

## 2023-05-03 DIAGNOSIS — Z780 Asymptomatic menopausal state: Secondary | ICD-10-CM

## 2023-05-03 DIAGNOSIS — J324 Chronic pansinusitis: Secondary | ICD-10-CM

## 2023-05-03 MED ORDER — AMOXICILLIN-POT CLAVULANATE 875-125 MG PO TABS: 1.0000 | ORAL_TABLET | Freq: Two times a day (BID) | ORAL | 0 refills | Status: DC

## 2023-05-03 MED ORDER — FLUTICASONE PROPIONATE 50 MCG/ACT NA SUSP: 2.0000 | Freq: Every day | NASAL | 0 refills | Status: DC

## 2023-05-03 NOTE — Progress Notes (Signed)
Acute Office Visit  Subjective:     Patient ID: Leslie Oconnell, female    DOB: 06-25-1951, 72 y.o.   MRN: [id number]  Chief Complaint  Patient presents with   sinus pressure    Patient c/o Left sided facial swelling and pain  - cough that produces white to clear phlegm x 6 mths comes and goes but this episode x 1 month    left ear pain    Left ear pain  dizziness - patient states seen at dentist one week ago and was needing to have a tooth pulled on left side due to a lose crown. She does not feel this is related.     HPI Patient is in today for left sided facial pain and swelling for more than one month. Blowing out colored sputum.  Hx of sinusitis and sinus pressure and congestion. She is coughing up clear phelgm. Left upper molar crown loose and needs to be pulled. She felt better after augmentin in December for about 1 month before congestion symptoms came back. She does use nasal lavage and very helpful. She continues to smoke 6-7 cigs a day. She is not sure if ready to quit.   Needs labs to get refills on medication.   .. 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  Objective:    BP 126/65   Pulse 94   Ht 5\' 1"  (1.549 m)   Wt 113 lb (51.3 kg)   SpO2 99%   BMI 21.35 kg/m  BP Readings from Last 3 Encounters:  05/03/23 126/65  08/09/22 126/73  06/11/21 116/83   Wt Readings from Last 3 Encounters:  05/03/23 113 lb (51.3 kg)  08/09/22 117 lb (53.1 kg)  01/15/21 121 lb 1.3 oz (54.9 kg)      Physical Exam Constitutional:      Appearance: Normal appearance.  HENT:     Head: Normocephalic.     Comments: Left maxillary sinus pressure to palpation and  swelling.     Right Ear: Tympanic membrane, ear canal and external ear normal. There is no impacted cerumen.     Left Ear: Tympanic membrane, ear canal and external ear normal. There is no impacted cerumen.     Nose: Congestion present.     Mouth/Throat:     Pharynx: Posterior oropharyngeal erythema present. No oropharyngeal exudate.  Eyes:     Extraocular Movements: Extraocular movements intact.     Conjunctiva/sclera: Conjunctivae normal.     Pupils: Pupils are equal, round, and reactive to light.  Cardiovascular:     Rate and Rhythm: Normal rate and regular rhythm.  Pulmonary:     Effort: Pulmonary effort is normal.     Comments: Decreased air movement bilaterally.  Musculoskeletal:     Cervical back: Normal range of motion and neck supple. No tenderness.     Right lower leg: No edema.     Left lower leg: No edema.  Lymphadenopathy:     Cervical: No cervical adenopathy.  Neurological:     General: No focal deficit present.     Mental Status: She is alert and oriented to person, place, and  time.  Psychiatric:        Mood and Affect: Mood normal.         Assessment & Plan:  Marland KitchenMarland KitchenLandrie was seen today for sinus pressure and left ear pain.  Diagnoses and all orders for this visit:  Left maxillary sinusitis -     amoxicillin-clavulanate (AUGMENTIN) 875-125 MG tablet; Take 1 tablet by mouth 2 (two) times daily. -     fluticasone (FLONASE) 50 MCG/ACT nasal spray; Place 2 sprays into both nostrils daily.  Mixed hyperlipidemia -     Lipid panel -     CMP14+EGFR  Screening for diabetes mellitus -     CMP14+EGFR  Screening for thyroid disorder -     TSH  Medication management -     CBC w/Diff/Platelet  Osteopenia, unspecified location -     DG Bone Density; Future  Post-menopausal -     DG Bone Density; Future  Current smoker -     DG Bone Density; Future  Chronic pansinusitis -     fluticasone (FLONASE) 50 MCG/ACT nasal spray; Place 2 sprays into both nostrils  daily.  Screening for lung cancer -     Ambulatory Referral Lung Cancer Screening [facility]   Vitals look good.  Will get fasting labs today Needs follow up bone density after 2 years On vitamin D and calcium Declined help with smoking cessation Agreed for referral for lung cancer screening  Augmentin sent for sinusitis Added flonase and told to continue for chronic sinusitis and prevention Continue nasal lavage    Return if symptoms worsen or fail to improve, for Needs to schedule Medicare Wellness Exam.  Tandy Gaw, PA-C

## 2023-05-03 NOTE — Patient Instructions (Addendum)
CT lung cancer screening  Get labs today Start augmentin and flonase Continue on flonase   Sinus Infection, Adult A sinus infection is soreness and swelling (inflammation) of your sinuses. Sinuses are hollow spaces in the bones around your face. They are located: Around your eyes. In the middle of your forehead. Behind your nose. In your cheekbones. Your sinuses and nasal passages are lined with a fluid called mucus. Mucus drains out of your sinuses. Swelling can trap mucus in your sinuses. This lets germs (bacteria, virus, or fungus) grow, which leads to infection. Most of the time, this condition is caused by a virus. What are the causes? Allergies. Asthma. Germs. Things that block your nose or sinuses. Growths in the nose (nasal polyps). Chemicals or irritants in the air. A fungus. This is rare. What increases the risk? Having a weak body defense system (immune system). Doing a lot of swimming or diving. Using nasal sprays too much. Smoking. What are the signs or symptoms? The main symptoms of this condition are pain and a feeling of pressure around the sinuses. Other symptoms include: Stuffy nose (congestion). This may make it hard to breathe through your nose. Runny nose (drainage). Soreness, swelling, and warmth in the sinuses. A cough that may get worse at night. Being unable to smell and taste. Mucus that collects in the throat or the back of the nose (postnasal drip). This may cause a sore throat or bad breath. Being very tired (fatigued). A fever. How is this diagnosed? Your symptoms. Your medical history. A physical exam. Tests to find out if your condition is short-term (acute) or long-term (chronic). Your doctor may: Check your nose for growths (polyps). Check your sinuses using a tool that has a light on one end (endoscope). Check for allergies or germs. Do imaging tests, such as an MRI or CT scan. How is this treated? Treatment for this condition depends  on the cause and whether it is short-term or long-term. If caused by a virus, your symptoms should go away on their own within 10 days. You may be given medicines to relieve symptoms. They include: Medicines that shrink swollen tissue in the nose. A spray that treats swelling of the nostrils. Rinses that help get rid of thick mucus in your nose (nasal saline washes). Medicines that treat allergies (antihistamines). Over-the-counter pain relievers. If caused by bacteria, your doctor may wait to see if you will get better without treatment. You may be given antibiotic medicine if you have: A very bad infection. A weak body defense system. If caused by growths in the nose, surgery may be needed. Follow these instructions at home: Medicines Take, use, or apply over-the-counter and prescription medicines only as told by your doctor. These may include nasal sprays. If you were prescribed an antibiotic medicine, take it as told by your doctor. Do not stop taking it even if you start to feel better. Hydrate and humidify  Drink enough water to keep your pee (urine) pale yellow. Use a cool mist humidifier to keep the humidity level in your home above 50%. Breathe in steam for 10-15 minutes, 3-4 times a day, or as told by your doctor. You can do this in the bathroom while a hot shower is running. Try not to spend time in cool or dry air. Rest Rest as much as you can. Sleep with your head raised (elevated). Make sure you get enough sleep each night. General instructions  Put a warm, moist washcloth on your face 3-4 times  a day, or as often as told by your doctor. Use nasal saline washes as often as told by your doctor. Wash your hands often with soap and water. If you cannot use soap and water, use hand sanitizer. Do not smoke. Avoid being around people who are smoking (secondhand smoke). Keep all follow-up visits. Contact a doctor if: You have a fever. Your symptoms get worse. Your symptoms  do not get better within 10 days. Get help right away if: You have a very bad headache. You cannot stop vomiting. You have very bad pain or swelling around your face or eyes. You have trouble seeing. You feel confused. Your neck is stiff. You have trouble breathing. These symptoms may be an emergency. Get help right away. Call 911. Do not wait to see if the symptoms will go away. Do not drive yourself to the hospital. Summary A sinus infection is swelling of your sinuses. Sinuses are hollow spaces in the bones around your face. This condition is caused by tissues in your nose that become inflamed or swollen. This traps germs. These can lead to infection. If you were prescribed an antibiotic medicine, take it as told by your doctor. Do not stop taking it even if you start to feel better. Keep all follow-up visits. This information is not intended to replace advice given to you by your health care provider. Make sure you discuss any questions you have with your health care provider. Document Revised: 07/27/2021 Document Reviewed: 07/27/2021 Elsevier Patient Education  2024 [employer].

## 2023-05-04 ENCOUNTER — Other Ambulatory Visit: Payer: Self-pay | Admitting: Physician Assistant

## 2023-05-04 LAB — CBC WITH DIFFERENTIAL/PLATELET
Basophils Absolute: 0.1 10*3/uL (ref 0.0–0.2)
Basos: 1 %
EOS (ABSOLUTE): 0.1 10*3/uL (ref 0.0–0.4)
Eos: 1 %
Hematocrit: 47.5 % — ABNORMAL HIGH (ref 34.0–46.6)
Hemoglobin: 16.1 g/dL — ABNORMAL HIGH (ref 11.1–15.9)
Immature Grans (Abs): 0 10*3/uL (ref 0.0–0.1)
Immature Granulocytes: 0 %
Lymphocytes Absolute: 1.5 10*3/uL (ref 0.7–3.1)
Lymphs: 23 %
MCH: 32.7 pg (ref 26.6–33.0)
MCHC: 33.9 g/dL (ref 31.5–35.7)
MCV: 97 fL (ref 79–97)
Monocytes Absolute: 0.4 10*3/uL (ref 0.1–0.9)
Monocytes: 6 %
Neutrophils Absolute: 4.3 10*3/uL (ref 1.4–7.0)
Neutrophils: 69 %
Platelets: 259 10*3/uL (ref 150–450)
RBC: 4.92 x10E6/uL (ref 3.77–5.28)
RDW: 12.7 % (ref 11.7–15.4)
WBC: 6.3 10*3/uL (ref 3.4–10.8)

## 2023-05-04 LAB — CMP14+EGFR
ALT: 14 IU/L (ref 0–32)
AST: 16 IU/L (ref 0–40)
Albumin: 4.6 g/dL (ref 3.8–4.8)
Alkaline Phosphatase: 51 IU/L (ref 44–121)
BUN/Creatinine Ratio: 14 (ref 12–28)
BUN: 10 mg/dL (ref 8–27)
Bilirubin Total: 0.5 mg/dL (ref 0.0–1.2)
CO2: 27 mmol/L (ref 20–29)
Calcium: 9.4 mg/dL (ref 8.7–10.3)
Chloride: 102 mmol/L (ref 96–106)
Creatinine, Ser: 0.7 mg/dL (ref 0.57–1.00)
Globulin, Total: 2.2 g/dL (ref 1.5–4.5)
Glucose: 85 mg/dL (ref 70–99)
Potassium: 4.2 mmol/L (ref 3.5–5.2)
Sodium: 141 mmol/L (ref 134–144)
Total Protein: 6.8 g/dL (ref 6.0–8.5)
eGFR: 92 mL/min/{1.73_m2} (ref >59)

## 2023-05-04 LAB — LIPID PANEL
Chol/HDL Ratio: 2.3 ratio (ref 0.0–4.4)
Cholesterol, Total: 167 mg/dL (ref 100–199)
HDL: 72 mg/dL (ref >39)
LDL Chol Calc (NIH): 78 mg/dL (ref 0–99)
Triglycerides: 91 mg/dL (ref 0–149)
VLDL Cholesterol Cal: 17 mg/dL (ref 5–40)

## 2023-05-04 LAB — TSH: TSH: 1.11 u[IU]/mL (ref 0.450–4.500)

## 2023-05-04 MED ORDER — ATORVASTATIN CALCIUM 10 MG PO TABS: ORAL_TABLET | ORAL | 3 refills | Status: DC

## 2023-05-04 NOTE — Progress Notes (Signed)
Cholesterol looks great.  Hemoglobin is elevated but secondary to smoking. Please keep trying to cut back and let me know if you want to try any medication help!

## 2023-05-17 ENCOUNTER — Ambulatory Visit (INDEPENDENT_AMBULATORY_CARE_PROVIDER_SITE_OTHER): Payer: Medicare Other

## 2023-05-17 DIAGNOSIS — M8580 Other specified disorders of bone density and structure, unspecified site: Secondary | ICD-10-CM | POA: Diagnosis not present

## 2023-05-17 DIAGNOSIS — Z780 Asymptomatic menopausal state: Secondary | ICD-10-CM | POA: Diagnosis not present

## 2023-05-17 DIAGNOSIS — M8589 Other specified disorders of bone density and structure, multiple sites: Secondary | ICD-10-CM | POA: Diagnosis not present

## 2023-05-17 DIAGNOSIS — F17200 Nicotine dependence, unspecified, uncomplicated: Secondary | ICD-10-CM

## 2023-05-17 NOTE — Progress Notes (Signed)
Leslie Oconnell,   Bone density worsened just a bit but still in osteopenia range. Continue with vitamin D and calcium. Work on smoking cessation. Recheck in 2 years.

## 2023-05-19 DIAGNOSIS — Z23 Encounter for immunization: Secondary | ICD-10-CM | POA: Diagnosis not present

## 2023-05-25 ENCOUNTER — Other Ambulatory Visit: Payer: Self-pay | Admitting: Physician Assistant

## 2023-05-25 DIAGNOSIS — J320 Chronic maxillary sinusitis: Secondary | ICD-10-CM

## 2023-05-25 DIAGNOSIS — J324 Chronic pansinusitis: Secondary | ICD-10-CM

## 2023-06-29 ENCOUNTER — Other Ambulatory Visit: Payer: Self-pay

## 2023-06-29 DIAGNOSIS — Z87891 Personal history of nicotine dependence: Secondary | ICD-10-CM

## 2023-06-29 DIAGNOSIS — Z122 Encounter for screening for malignant neoplasm of respiratory organs: Secondary | ICD-10-CM

## 2023-06-29 DIAGNOSIS — F17210 Nicotine dependence, cigarettes, uncomplicated: Secondary | ICD-10-CM

## 2023-06-30 ENCOUNTER — Ambulatory Visit (INDEPENDENT_AMBULATORY_CARE_PROVIDER_SITE_OTHER): Payer: Medicare Other | Admitting: Acute Care

## 2023-06-30 DIAGNOSIS — F17210 Nicotine dependence, cigarettes, uncomplicated: Secondary | ICD-10-CM

## 2023-06-30 NOTE — Progress Notes (Addendum)
 Provider Attestation I agree with the documentation of the Shared Decision Making visit,  smoking cessation counseling if appropriate, and verification or eligibility for lung cancer screening as documented by the RN Nurse Navigator.   Lauraine PHEBE Lites, MSN, AGACNP-BC [facility] See Amion for personal pager PCCM on call pager [phone]     Virtual Visit via Telephone Note  I connected with Shona Critchley on 06/30/23 at 11:00 AM EDT by telephone and verified that I am speaking with the correct person using two identifiers.  Location: Patient: Leslie Oconnell Provider: Laneta Speaks, RN   I discussed the limitations, risks, security and privacy concerns of performing an evaluation and management service by telephone and the availability of in person appointments. I also discussed with the patient that there may be a patient responsible charge related to this service. The patient expressed understanding and agreed to proceed.   Shared Decision Making Visit Lung Cancer Screening Program [id number])   Eligibility: Age 72 y.o. Pack Years Smoking History Calculation 24 (# packs/per year x # years smoked) Recent History of coughing up blood  no Unexplained weight loss? no ( >Than 15 pounds within the last 6 months ) Prior History Lung / other cancer no (Diagnosis within the last 5 years already requiring surveillance chest CT Scans). Smoking Status Current Smoker   Visit Components: Discussion included one or more decision making aids. yes Discussion included risk/benefits of screening. yes Discussion included potential follow up diagnostic testing for abnormal scans. yes Discussion included meaning and risk of over diagnosis. yes Discussion included meaning and risk of False Positives. yes Discussion included meaning of total radiation exposure. yes  Counseling Included: Importance of adherence to annual lung cancer LDCT screening. yes Impact of  comorbidities on ability to participate in the program. yes Ability and willingness to under diagnostic treatment. yes  Smoking Cessation Counseling: Current Smokers:  Discussed importance of smoking cessation. yes Information about tobacco cessation classes and interventions provided to patient. yes Patient provided with ticket for LDCT Scan. no Symptomatic Patient. no  Counseling(Intermediate counseling: > three minutes) 99406 Diagnosis Code: Tobacco Use Z72.0 Asymptomatic Patient yes  Counseling (Intermediate counseling: > three minutes counseling) [id number]  Written Order for Lung Cancer Screening with LDCT placed in Epic. Yes (CT Chest Lung Cancer Screening Low Dose W/O CM) [id number] Z12.2-Screening of respiratory organs Z87.891-Personal history of nicotine dependence   Laneta Speaks, RN

## 2023-06-30 NOTE — Patient Instructions (Signed)

## 2023-07-05 ENCOUNTER — Ambulatory Visit: Payer: Medicare Other

## 2023-07-05 DIAGNOSIS — Z122 Encounter for screening for malignant neoplasm of respiratory organs: Secondary | ICD-10-CM | POA: Diagnosis not present

## 2023-07-05 DIAGNOSIS — F17210 Nicotine dependence, cigarettes, uncomplicated: Secondary | ICD-10-CM | POA: Diagnosis not present

## 2023-07-05 DIAGNOSIS — Z87891 Personal history of nicotine dependence: Secondary | ICD-10-CM

## 2023-08-07 ENCOUNTER — Other Ambulatory Visit: Payer: Self-pay

## 2023-08-07 DIAGNOSIS — F17210 Nicotine dependence, cigarettes, uncomplicated: Secondary | ICD-10-CM

## 2023-08-07 DIAGNOSIS — Z122 Encounter for screening for malignant neoplasm of respiratory organs: Secondary | ICD-10-CM

## 2023-08-07 DIAGNOSIS — Z87891 Personal history of nicotine dependence: Secondary | ICD-10-CM

## 2023-08-08 ENCOUNTER — Encounter: Payer: Self-pay | Admitting: Physician Assistant

## 2023-08-08 ENCOUNTER — Other Ambulatory Visit: Payer: Self-pay | Admitting: Physician Assistant

## 2023-08-08 DIAGNOSIS — J439 Emphysema, unspecified: Secondary | ICD-10-CM | POA: Insufficient documentation

## 2023-08-08 DIAGNOSIS — I700 Atherosclerosis of aorta: Secondary | ICD-10-CM

## 2023-08-08 DIAGNOSIS — J432 Centrilobular emphysema: Secondary | ICD-10-CM

## 2023-11-07 ENCOUNTER — Encounter: Payer: Self-pay | Admitting: Physician Assistant

## 2023-11-07 ENCOUNTER — Ambulatory Visit (INDEPENDENT_AMBULATORY_CARE_PROVIDER_SITE_OTHER): Admitting: Physician Assistant

## 2023-11-07 VITALS — BP 121/73 | HR 101 | Temp 97.6°F | Ht 61.0 in | Wt 110.0 lb

## 2023-11-07 DIAGNOSIS — J432 Centrilobular emphysema: Secondary | ICD-10-CM

## 2023-11-07 DIAGNOSIS — J0140 Acute pansinusitis, unspecified: Secondary | ICD-10-CM

## 2023-11-07 DIAGNOSIS — R0902 Hypoxemia: Secondary | ICD-10-CM | POA: Insufficient documentation

## 2023-11-07 DIAGNOSIS — J441 Chronic obstructive pulmonary disease with (acute) exacerbation: Secondary | ICD-10-CM

## 2023-11-07 MED ORDER — AZITHROMYCIN 250 MG PO TABS: ORAL_TABLET | ORAL | 0 refills | Status: AC

## 2023-11-07 MED ORDER — UMECLIDINIUM-VILANTEROL 62.5-25 MCG/ACT IN AEPB: 1.0000 | INHALATION_SPRAY | Freq: Every day | RESPIRATORY_TRACT | 2 refills | Status: DC

## 2023-11-07 MED ORDER — ALBUTEROL SULFATE HFA 108 (90 BASE) MCG/ACT IN AERS: 2.0000 | INHALATION_SPRAY | Freq: Four times a day (QID) | RESPIRATORY_TRACT | 0 refills | Status: AC | PRN

## 2023-11-07 MED ORDER — PREDNISONE 20 MG PO TABS: ORAL_TABLET | ORAL | 0 refills | Status: DC

## 2023-11-07 NOTE — Patient Instructions (Addendum)
 Start Anoro inhaler 1 puff daily.  Start zpak for 5 days.  Start prednisone taper.  Use albuterol inhaler as needed for shortness of breath or cough Follow up in 3-4 weeks  Chronic Obstructive Pulmonary Disease Exacerbation  Chronic obstructive pulmonary disease (COPD) is a long-term (chronic) lung problem. When you have COPD, it can feel harder to breathe in or out. COPD exacerbation is a flare-up of symptoms when breathing gets worse and more treatment may be needed. Without treatment, flare-ups can be life-threatening. If they happen often, your lungs can become more damaged. What are the causes? Not taking your usual COPD medicines as told by your health care provider. A cold or the flu, which can cause infection in your lungs. Being exposed to things that make your breathing worse, such as: Smoke. Air pollution. Fumes. Dust. Allergies. Weather changes. What are the signs or symptoms? Symptoms do not get better or get worse even if you take your medicines as told by your provider. Symptoms may include: More shortness of breath. You may only be able to speak one or two words at a time. More coughing or mucus from your lungs. More wheezing or chest tightness. Being more tired and having less energy. Confusion. How is this diagnosed? This condition is diagnosed based on: Symptoms that get worse. Your medical history. A physical exam. You may also have tests, including: A chest X-ray. Blood or mucus tests. How is this treated? You may be able to stay home or you may need to go to the hospital. Treatment may include: Taking medicines. These may include: Inhalers. These have medicines in them that you breathe in. These may be more of what you already take or they may be new. Steroids. These reduce inflammation in the airways. These may be inhaled, taken by mouth, or given in an IV. Antibiotics. These treat infection. Using oxygen. Using a device to help you clear  mucus. Follow these instructions at home: Medicines Take your medicines only as told by your provider. If you were given antibiotics or steroids, take them as told by your provider. Do not stop taking them even if you start to feel better. Lifestyle Several times a day, wash your hands with soap and water for at least 20 seconds. If you cannot use soap and water, use hand sanitizer. This may help keep you from getting an infection. Avoid being around crowds or people who are sick. Do not smoke or use any products that contain nicotine or tobacco. If you need help quitting, ask your provider. Return to your normal activities when your provider says that it's safe. Use breathing methods to control your stress and catch your breath. How is this prevented? Follow your COPD action plan. The action plan tells you what to do if you're feeling good and what to do when you start feeling worse. Discuss the plan often with your provider. Make sure you get all the shots, also called vaccines, that your provider recommends. Ask your provider about a flu shot and a pneumonia shot. Use oxygen therapy if told by your provider. If you need home oxygen therapy, ask your provider how often to check your oxygen level with a device called an oximeter. Keep all follow-up visits to review your COPD action plan. Your provider will want to check on your condition often to keep you healthy and out of the hospital. Contact a health care provider if: Your COPD symptoms get worse. You have a fever or chills. You have trouble  doing daily activities. You have trouble breathing even when you are resting. Get help right away if: You are short of breath and cannot: Talk in full sentences. Do normal activities. You have chest pain. You feel confused. These symptoms may be an emergency. Call 911 right away. Do not wait to see if the symptoms will go away. Do not drive yourself to the hospital. This information is not  intended to replace advice given to you by your health care provider. Make sure you discuss any questions you have with your health care provider. Document Revised: 05/25/2023 Document Reviewed: 11/07/2022 Elsevier Patient Education  2024 [employer].

## 2023-11-07 NOTE — Progress Notes (Signed)
 Acute Office Visit  Subjective:     Patient ID: Leslie Oconnell, female    DOB: 1950/10/30, 73 y.o.   MRN: [id number]  CC: sinus congestion, cough  HPI Patient is a 73 yo female who presents with a chief complaint of sinus congestion, cough, and increased sputum production x 3 days. She has always had sinus issues. Since Sunday, her eyes have been swollen and filled with 'gunk.' She coughs all night long that keeps her up with increased sputum production that is green and thick. She also endorses sinus pressure. She does smoke and has a history of emphysema. She is short of breath. She does not have inhaler. Denies fever, chills, or chest pain. She did get her influenza vaccine, but not her COVID vaccine.  .. 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   Respiratory:  Positive for cough and sputum production.   All other systems reviewed and are negative.     Objective:    BP 121/73 (BP Location: Left Arm, Patient Position: Sitting, Cuff Size: Small)   Pulse (!) 101   Temp 97.6 F (36.4 C) (Oral)   Ht 5\' 1"  (1.549 m)   Wt 110 lb (49.9 kg)   SpO2 93%   BMI 20.78 kg/m  BP Readings from Last 3 Encounters:  11/07/23 121/73  05/03/23 126/65  08/09/22 126/73   Wt Readings from Last 3 Encounters:  11/07/23 110 lb (49.9 kg)  07/05/23 113 lb (51.3 kg)  05/03/23 113 lb (51.3 kg)   SpO2 Readings from Last 3 Encounters:  11/07/23 93%  05/03/23 99%  06/11/21 97%   Physical Exam Constitutional:       Appearance: She is ill-appearing.  HENT:     Head: Normocephalic and atraumatic.     Right Ear: Tympanic membrane, ear canal and external ear normal. There is no impacted cerumen.     Left Ear: Tympanic membrane, ear canal and external ear normal. There is no impacted cerumen.     Nose: Congestion present.     Mouth/Throat:     Mouth: Mucous membranes are moist.     Pharynx: Posterior oropharyngeal erythema present. No oropharyngeal exudate.  Eyes:     Extraocular Movements: Extraocular movements intact.  Cardiovascular:     Rate and Rhythm: Regular rhythm. Tachycardia present.     Pulses: Normal pulses.     Heart sounds: Normal heart sounds.  Pulmonary:     Effort: Pulmonary effort is normal.     Comments: Decreased air movement on the  lower left and right lobes bilaterally After nebulizer wheezing heard at bilateral bases Musculoskeletal:        General: Normal range of motion.     Cervical back: Normal range of motion.  Skin:    Comments: Cold fingers   Neurological:     Mental Status: She is alert and oriented to person, place, and time.  Psychiatric:        Mood and Affect: Mood normal.  Behavior: Behavior normal.   Nebulizer given with duoneb in office that increased cough production.  Assessment & Plan:  Marland KitchenMarland KitchenLunah was seen today for sinusitis.  Diagnoses and all orders for this visit:  COPD exacerbation (HCC) -     azithromycin (ZITHROMAX) 250 MG tablet; Take 2 tablets on day 1, then 1 tablet daily on days 2 through 5 -     predniSONE (DELTASONE) 20 MG tablet; Take 3 tablets daily for 3 days, take 2 tablets daily for 3 days, take 1 tablet for 3 days, take 1/2 tablets for 4 days. -     umeclidinium-vilanterol (ANORO ELLIPTA) 62.5-25 MCG/ACT AEPB; Inhale 1 puff into the lungs daily at 6 (six) AM.  Hypoxia -     azithromycin (ZITHROMAX) 250 MG tablet; Take 2 tablets on day 1, then 1 tablet daily on days 2 through 5 -     predniSONE (DELTASONE) 20 MG tablet; Take 3  tablets daily for 3 days, take 2 tablets daily for 3 days, take 1 tablet for 3 days, take 1/2 tablets for 4 days. -     umeclidinium-vilanterol (ANORO ELLIPTA) 62.5-25 MCG/ACT AEPB; Inhale 1 puff into the lungs daily at 6 (six) AM.  Centrilobular emphysema (HCC) -     azithromycin (ZITHROMAX) 250 MG tablet; Take 2 tablets on day 1, then 1 tablet daily on days 2 through 5 -     predniSONE (DELTASONE) 20 MG tablet; Take 3 tablets daily for 3 days, take 2 tablets daily for 3 days, take 1 tablet for 3 days, take 1/2 tablets for 4 days. -     umeclidinium-vilanterol (ANORO ELLIPTA) 62.5-25 MCG/ACT AEPB; Inhale 1 puff into the lungs daily at 6 (six) AM.  Acute non-recurrent pansinusitis -     azithromycin (ZITHROMAX) 250 MG tablet; Take 2 tablets on day 1, then 1 tablet daily on days 2 through 5 -     predniSONE (DELTASONE) 20 MG tablet; Take 3 tablets daily for 3 days, take 2 tablets daily for 3 days, take 1 tablet for 3 days, take 1/2 tablets for 4 days.   Suspect viral etiology complicated by emphysema and exacerbation. Pt is hypoxic today with pulse ox below 90 before she was able to sit for a little while and went back up to 93 percent. Consider getting home pulse ox to monitor this at home.  - Nebulizer with albuterol today is office - Start Azithromycin - Start LABA/LAMA combo inhaler daily for symptom control - Start Prednisone - use albuterol as needed every 2-6 hours for SOB/cough - F/u in 3-4 weeks to assess medication tolerance and compliance  Tandy Gaw, PA-C

## 2023-11-08 ENCOUNTER — Encounter: Payer: Self-pay | Admitting: Physician Assistant

## 2023-12-11 ENCOUNTER — Encounter: Payer: Self-pay | Admitting: Physician Assistant

## 2023-12-11 ENCOUNTER — Ambulatory Visit (INDEPENDENT_AMBULATORY_CARE_PROVIDER_SITE_OTHER): Admitting: Physician Assistant

## 2023-12-11 VITALS — BP 119/57 | HR 57 | Ht 61.0 in | Wt 113.0 lb

## 2023-12-11 DIAGNOSIS — J302 Other seasonal allergic rhinitis: Secondary | ICD-10-CM | POA: Insufficient documentation

## 2023-12-11 DIAGNOSIS — J432 Centrilobular emphysema: Secondary | ICD-10-CM | POA: Diagnosis not present

## 2023-12-11 DIAGNOSIS — F17200 Nicotine dependence, unspecified, uncomplicated: Secondary | ICD-10-CM

## 2023-12-11 NOTE — Progress Notes (Signed)
   Established Patient Office Visit  Subjective   Patient ID: Leslie Oconnell, female    DOB: August 07, 1951  Age: 73 y.o. MRN: [id number]  Chief Complaint  Patient presents with   Medical Management of Chronic Issues    COPD exacerbation (HCC)    HPI Pt is a 73 yo female who presents to the clinic after COPD exacerbation. She is doing much better on anoro. She is coughing less and feels like she can get a much deeper breath. She is not using albuterol at all. She has coughed a little more over the last few days but believes due to pollen. She continues to smoke but cutting back to 1-3 cigarettes a day.    Review of Systems  All other systems reviewed and are negative.     Objective:     BP (!) 119/57   Pulse (!) 57   Ht 5\' 1"  (1.549 m)   Wt 113 lb (51.3 kg)   SpO2 97%   BMI 21.35 kg/m  BP Readings from Last 3 Encounters:  12/11/23 (!) 119/57  11/07/23 121/73  05/03/23 126/65   Wt Readings from Last 3 Encounters:  12/11/23 113 lb (51.3 kg)  11/07/23 110 lb (49.9 kg)  07/05/23 113 lb (51.3 kg)      Physical Exam Constitutional:      Appearance: Normal appearance.  HENT:     Head: Normocephalic.  Cardiovascular:     Rate and Rhythm: Normal rate and regular rhythm.  Pulmonary:     Effort: Pulmonary effort is normal.     Breath sounds: Normal breath sounds.  Musculoskeletal:     Right lower leg: No edema.     Left lower leg: No edema.  Neurological:     General: No focal deficit present.     Mental Status: She is alert and oriented to person, place, and time.  Psychiatric:        Mood and Affect: Mood normal.       The 10-year ASCVD risk score (Arnett DK, et al., 2019) is: 16.2%    Assessment & Plan:  Marland KitchenMarland KitchenRakia was seen today for medical management of chronic issues.  Diagnoses and all orders for this visit:  Centrilobular emphysema (HCC)  Current smoker  Seasonal allergies   Pt doing much better Vitals look great, pulse ox 97 percent Labs look  great today Anoro has helped a lot with breathing will continue and use albuterol as needed Pt is smoking less and continues to cut back but has not quit Lung cancer screening due to October Needs MWV  Ok to start clartin or zyrtec for allergies and pollen for next month or so   Return in about 4 months (around 04/11/2024) for CPE.    Tandy Gaw, PA-C

## 2024-02-05 ENCOUNTER — Encounter: Payer: Self-pay | Admitting: Physician Assistant

## 2024-02-05 ENCOUNTER — Other Ambulatory Visit: Payer: Self-pay | Admitting: Physician Assistant

## 2024-02-05 DIAGNOSIS — R0902 Hypoxemia: Secondary | ICD-10-CM

## 2024-02-05 DIAGNOSIS — J432 Centrilobular emphysema: Secondary | ICD-10-CM

## 2024-02-05 DIAGNOSIS — J441 Chronic obstructive pulmonary disease with (acute) exacerbation: Secondary | ICD-10-CM

## 2024-02-05 MED ORDER — UMECLIDINIUM-VILANTEROL 62.5-25 MCG/ACT IN AEPB: 1.0000 | INHALATION_SPRAY | Freq: Every day | RESPIRATORY_TRACT | 2 refills | Status: DC

## 2024-02-05 NOTE — Telephone Encounter (Signed)
 Requesting rx rf of anorro elipta Last written 11/07/2023 Last OV 12/11/2023

## 2024-02-21 ENCOUNTER — Other Ambulatory Visit: Payer: Self-pay | Admitting: Physician Assistant

## 2024-02-21 DIAGNOSIS — Z1231 Encounter for screening mammogram for malignant neoplasm of breast: Secondary | ICD-10-CM

## 2024-03-07 ENCOUNTER — Ambulatory Visit

## 2024-03-07 DIAGNOSIS — Z1231 Encounter for screening mammogram for malignant neoplasm of breast: Secondary | ICD-10-CM

## 2024-03-12 ENCOUNTER — Ambulatory Visit: Payer: Self-pay | Admitting: Physician Assistant

## 2024-03-12 NOTE — Progress Notes (Signed)
 Normal mammogram. Follow up in 1 year.

## 2024-03-27 ENCOUNTER — Encounter: Payer: Self-pay | Admitting: Acute Care

## 2024-04-30 ENCOUNTER — Other Ambulatory Visit: Payer: Self-pay | Admitting: Physician Assistant

## 2024-04-30 DIAGNOSIS — J441 Chronic obstructive pulmonary disease with (acute) exacerbation: Secondary | ICD-10-CM

## 2024-04-30 DIAGNOSIS — J432 Centrilobular emphysema: Secondary | ICD-10-CM

## 2024-04-30 DIAGNOSIS — R0902 Hypoxemia: Secondary | ICD-10-CM

## 2024-05-16 ENCOUNTER — Other Ambulatory Visit: Payer: Self-pay | Admitting: Physician Assistant

## 2024-05-20 ENCOUNTER — Encounter: Payer: Self-pay | Admitting: Physician Assistant

## 2024-05-21 ENCOUNTER — Encounter: Payer: Self-pay | Admitting: Physician Assistant

## 2024-05-21 ENCOUNTER — Ambulatory Visit

## 2024-05-21 ENCOUNTER — Ambulatory Visit (INDEPENDENT_AMBULATORY_CARE_PROVIDER_SITE_OTHER): Admitting: Physician Assistant

## 2024-05-21 VITALS — BP 125/73 | HR 78 | Temp 98.1°F | Resp 98 | Ht 61.0 in | Wt 113.0 lb

## 2024-05-21 DIAGNOSIS — Z131 Encounter for screening for diabetes mellitus: Secondary | ICD-10-CM

## 2024-05-21 DIAGNOSIS — J441 Chronic obstructive pulmonary disease with (acute) exacerbation: Secondary | ICD-10-CM

## 2024-05-21 DIAGNOSIS — I700 Atherosclerosis of aorta: Secondary | ICD-10-CM

## 2024-05-21 DIAGNOSIS — J432 Centrilobular emphysema: Secondary | ICD-10-CM

## 2024-05-21 DIAGNOSIS — F17200 Nicotine dependence, unspecified, uncomplicated: Secondary | ICD-10-CM | POA: Diagnosis not present

## 2024-05-21 DIAGNOSIS — Z0000 Encounter for general adult medical examination without abnormal findings: Secondary | ICD-10-CM

## 2024-05-21 DIAGNOSIS — E782 Mixed hyperlipidemia: Secondary | ICD-10-CM

## 2024-05-21 DIAGNOSIS — R0902 Hypoxemia: Secondary | ICD-10-CM

## 2024-05-21 MED ORDER — NICOTINE 21 MG/24HR TD PT24: 21.0000 mg | MEDICATED_PATCH | Freq: Every day | TRANSDERMAL | 1 refills | Status: DC

## 2024-05-21 MED ORDER — UMECLIDINIUM-VILANTEROL 62.5-25 MCG/ACT IN AEPB: 1.0000 | INHALATION_SPRAY | Freq: Every day | RESPIRATORY_TRACT | 5 refills | Status: DC

## 2024-05-21 NOTE — Patient Instructions (Signed)
 Managing the Challenge of Quitting Smoking Quitting smoking is a physical and mental challenge. You may have cravings, withdrawal symptoms, and temptation to smoke. Before quitting, work with your health care provider to make a plan that can help you manage quitting. Making a plan before you quit may keep you from smoking when you have the urge to smoke while trying to quit. How to manage lifestyle changes Managing stress Stress can make you want to smoke, and wanting to smoke may cause stress. It is important to find ways to manage your stress. You could try some of the following: Practice relaxation techniques. Breathe slowly and deeply, in through your nose and out through your mouth. Listen to music. Soak in a bath or take a shower. Imagine a peaceful place or vacation. Get some support. Talk with family or friends about your stress. Join a support group. Talk with a counselor or therapist. Get some physical activity. Go for a walk, run, or bike ride. Play a favorite sport. Practice yoga.  Medicines Talk with your health care provider about medicines that might help you deal with cravings and make quitting easier for you. Relationships Social situations can be difficult when you are quitting smoking. To manage this, you can: Avoid parties and other social situations where people might be smoking. Avoid alcohol. Leave right away if you have the urge to smoke. Explain to your family and friends that you are quitting smoking. Ask for support and let them know you might be a bit grumpy. Plan activities where smoking is not an option. General instructions Be aware that many people gain weight after they quit smoking. However, not everyone does. To keep from gaining weight, have a plan in place before you quit, and stick to the plan after you quit. Your plan should include: Eating healthy snacks. When you have a craving, it may help to: Eat popcorn, or try carrots, celery, or other cut  vegetables. Chew sugar-free gum. Changing how you eat. Eat small portion sizes at meals. Eat 4-6 small meals throughout the day instead of 1-2 large meals a day. Be mindful when you eat. You should avoid watching television or doing other things that might distract you as you eat. Exercising regularly. Make time to exercise each day. If you do not have time for a long workout, do short bouts of exercise for 5-10 minutes several times a day. Do some form of strengthening exercise, such as weight lifting. Do some exercise that gets your heart beating and causes you to breathe deeply, such as walking fast, running, swimming, or biking. This is very important. Drinking plenty of water or other low-calorie or no-calorie drinks. Drink enough fluid to keep your urine pale yellow.  How to recognize withdrawal symptoms Your body and mind may experience discomfort as you try to get used to not having nicotine  in your system. These effects are called withdrawal symptoms. They may include: Feeling hungrier than normal. Having trouble concentrating. Feeling irritable or restless. Having trouble sleeping. Feeling depressed. Craving a cigarette. These symptoms may surprise you, but they are normal to have when quitting smoking. To manage withdrawal symptoms: Avoid places, people, and activities that trigger your cravings. Remember why you want to quit. Get plenty of sleep. Avoid coffee and other drinks that contain caffeine. These may worsen some of your symptoms. How to manage cravings Come up with a plan for how to deal with your cravings. The plan should include the following: A definition of the specific situation  you want to deal with. An activity or action you will take to replace smoking. A clear idea for how this action will help. The name of someone who could help you with this. Cravings usually last for 5-10 minutes. Consider taking the following actions to help you with your plan to deal  with cravings: Keep your mouth busy. Chew sugar-free gum. Suck on hard candies or a straw. Brush your teeth. Keep your hands and body busy. Change to a different activity right away. Squeeze or play with a ball. Do an activity or a hobby, such as making bead jewelry, practicing needlepoint, or working with wood. Mix up your normal routine. Take a short exercise break. Go for a quick walk, or run up and down stairs. Focus on doing something kind or helpful for someone else. Call a friend or family member to talk during a craving. Join a support group. Contact a quitline. Where to find support To get help or find a support group: Call the National Cancer Institute's Smoking Quitline: 1-800-QUIT-NOW 848 566 7860) Text QUIT to SmokefreeTXT: 521151 Where to find more information Visit these websites to find more information on quitting smoking: [place]. Department of Health and Human Services: www.smokefree.gov American Lung Association: www.freedomfromsmoking.org Centers for Disease Control and Prevention (CDC): FootballExhibition.com.br American Heart Association: www.heart.org Contact a health care provider if: You want to change your plan for quitting. The medicines you are taking are not helping. Your eating feels out of control or you cannot sleep. You feel depressed or become very anxious. Summary Quitting smoking is a physical and mental challenge. You will face cravings, withdrawal symptoms, and temptation to smoke again. Preparation can help you as you go through these challenges. Try different techniques to manage stress, handle social situations, and prevent weight gain. You can deal with cravings by keeping your mouth busy (such as by chewing gum), keeping your hands and body busy, calling family or friends, or contacting a quitline for people who want to quit smoking. You can deal with withdrawal symptoms by avoiding places where people smoke, getting plenty of rest, and avoiding drinks that  contain caffeine. This information is not intended to replace advice given to you by your health care provider. Make sure you discuss any questions you have with your health care provider. Document Revised: 08/13/2021 Document Reviewed: 08/13/2021 Elsevier Patient Education  2024 [employer]  Health Maintenance After Age 23 After age 50, you are at a higher risk for certain long-term diseases and infections as well as injuries from falls. Falls are a major cause of broken bones and head injuries in people who are older than age 21. Getting regular preventive care can help to keep you healthy and well. Preventive care includes getting regular testing and making lifestyle changes as recommended by your health care provider. Talk with your health care provider about: Which screenings and tests you should have. A screening is a test that checks for a disease when you have no symptoms. A diet and exercise plan that is right for you. What should I know about screenings and tests to prevent falls? Screening and testing are the best ways to find a health problem early. Early diagnosis and treatment give you the best chance of managing medical conditions that are common after age 84. Certain conditions and lifestyle choices may make you more likely to have a fall. Your health care provider may recommend: Regular vision checks. Poor vision and conditions such as cataracts can make you more likely to have  a fall. If you wear glasses, make sure to get your prescription updated if your vision changes. Medicine review. Work with your health care provider to regularly review all of the medicines you are taking, including over-the-counter medicines. Ask your health care provider about any side effects that may make you more likely to have a fall. Tell your health care provider if any medicines that you take make you feel dizzy or sleepy. Strength and balance checks. Your health care provider may recommend certain  tests to check your strength and balance while standing, walking, or changing positions. Foot health exam. Foot pain and numbness, as well as not wearing proper footwear, can make you more likely to have a fall. Screenings, including: Osteoporosis screening. Osteoporosis is a condition that causes the bones to get weaker and break more easily. Blood pressure screening. Blood pressure changes and medicines to control blood pressure can make you feel dizzy. Depression screening. You may be more likely to have a fall if you have a fear of falling, feel depressed, or feel unable to do activities that you used to do. Alcohol use screening. Using too much alcohol can affect your balance and may make you more likely to have a fall. Follow these instructions at home: Lifestyle Do not drink alcohol if: Your health care provider tells you not to drink. If you drink alcohol: Limit how much you have to: 0-1 drink a day for women. 0-2 drinks a day for men. Know how much alcohol is in your drink. In the [place] one drink equals one 12 oz bottle of beer (355 mL), one 5 oz glass of wine (148 mL), or one 1 oz glass of hard liquor (44 mL). Do not use any products that contain nicotine  or tobacco. These products include cigarettes, chewing tobacco, and vaping devices, such as e-cigarettes. If you need help quitting, ask your health care provider. Activity  Follow a regular exercise program to stay fit. This will help you maintain your balance. Ask your health care provider what types of exercise are appropriate for you. If you need a cane or walker, use it as recommended by your health care provider. Wear supportive shoes that have nonskid soles. Safety  Remove any tripping hazards, such as rugs, cords, and clutter. Install safety equipment such as grab bars in bathrooms and safety rails on stairs. Keep rooms and walkways well-lit. General instructions Talk with your health care provider about your risks for  falling. Tell your health care provider if: You fall. Be sure to tell your health care provider about all falls, even ones that seem minor. You feel dizzy, tiredness (fatigue), or off-balance. Take over-the-counter and prescription medicines only as told by your health care provider. These include supplements. Eat a healthy diet and maintain a healthy weight. A healthy diet includes low-fat dairy products, low-fat (lean) meats, and fiber from whole grains, beans, and lots of fruits and vegetables. Stay current with your vaccines. Schedule regular health, dental, and eye exams. Summary Having a healthy lifestyle and getting preventive care can help to protect your health and wellness after age 8. Screening and testing are the best way to find a health problem early and help you avoid having a fall. Early diagnosis and treatment give you the best chance for managing medical conditions that are more common for people who are older than age 43. Falls are a major cause of broken bones and head injuries in people who are older than age 58. Take precautions to prevent  a fall at home. Work with your health care provider to learn what changes you can make to improve your health and wellness and to prevent falls. This information is not intended to replace advice given to you by your health care provider. Make sure you discuss any questions you have with your health care provider. Document Revised: 01/11/2021 Document Reviewed: 01/11/2021 Elsevier Patient Education  2024 [employer].

## 2024-05-22 ENCOUNTER — Encounter: Payer: Self-pay | Admitting: Physician Assistant

## 2024-05-22 ENCOUNTER — Ambulatory Visit: Payer: Self-pay | Admitting: Physician Assistant

## 2024-05-22 LAB — CMP14+EGFR
ALT: 11 IU/L (ref 0–32)
AST: 18 IU/L (ref 0–40)
Albumin: 4.3 g/dL (ref 3.8–4.8)
Alkaline Phosphatase: 48 IU/L — ABNORMAL LOW (ref 49–135)
BUN/Creatinine Ratio: 15 (ref 12–28)
BUN: 11 mg/dL (ref 8–27)
Bilirubin Total: 0.6 mg/dL (ref 0.0–1.2)
CO2: 25 mmol/L (ref 20–29)
Calcium: 9 mg/dL (ref 8.7–10.3)
Chloride: 105 mmol/L (ref 96–106)
Creatinine, Ser: 0.73 mg/dL (ref 0.57–1.00)
Globulin, Total: 1.9 g/dL (ref 1.5–4.5)
Glucose: 82 mg/dL (ref 70–99)
Potassium: 4.2 mmol/L (ref 3.5–5.2)
Sodium: 144 mmol/L (ref 134–144)
Total Protein: 6.2 g/dL (ref 6.0–8.5)
eGFR: 87 mL/min/1.73 (ref >59)

## 2024-05-22 LAB — LIPID PANEL
Chol/HDL Ratio: 2.1 ratio (ref 0.0–4.4)
Cholesterol, Total: 147 mg/dL (ref 100–199)
HDL: 70 mg/dL (ref >39)
LDL Chol Calc (NIH): 61 mg/dL (ref 0–99)
Triglycerides: 83 mg/dL (ref 0–149)
VLDL Cholesterol Cal: 16 mg/dL (ref 5–40)

## 2024-05-22 LAB — TSH: TSH: 1.02 u[IU]/mL (ref 0.450–4.500)

## 2024-05-22 MED ORDER — ATORVASTATIN CALCIUM 10 MG PO TABS: 10.0000 mg | ORAL_TABLET | Freq: Every day | ORAL | 3 refills | Status: AC

## 2024-05-22 NOTE — Progress Notes (Signed)
 Complete physical exam  Patient: Leslie Oconnell   DOB: 09-17-1950   73 y.o. Female  MRN: [id number]  Subjective:    Chief Complaint  Patient presents with   Annual Exam    Leslie Oconnell is a 73 y.o. female who presents today for a complete physical exam. She reports consuming a general diet. Pt is walking 8,000 steps daily. She generally feels well. She reports sleeping well. She does have additional problems to discuss today.   She would like to discuss smoking cessation.    Most recent fall risk assessment:    05/22/2024    6:12 AM  Fall Risk   Falls in the past year? 0  Number falls in past yr: 0  Injury with Fall? 0  Follow up Falls evaluation completed     Most recent depression screenings:    05/22/2024    6:12 AM 05/03/2023    9:54 AM  PHQ 2/9 Scores  PHQ - 2 Score 0 0    Vision:Within last year and Dental: No current dental problems and Receives regular dental care  Patient Active Problem List   Diagnosis Date Noted   Tobacco dependence 05/21/2024   Seasonal allergies 12/11/2023   COPD exacerbation (HCC) 11/07/2023   Hypoxia 11/07/2023   Aortic atherosclerosis (HCC) 08/08/2023   Emphysema lung (HCC) 08/08/2023   Chronic pansinusitis 05/03/2023   Elevated hematocrit 03/18/2022   Osteopenia 12/25/2013   Current smoker 11/27/2013   Hyperlipidemia 11/26/2012   Past Medical History:  Diagnosis Date   Hyperlipidemia    Past Surgical History:  Procedure Laterality Date   HERNIA REPAIR  1952   TONSILECTOMY/ADENOIDECTOMY WITH MYRINGOTOMY  1964   Family History  Problem Relation Age of Onset   Cancer Mother    Alzheimer's disease Father    Breast cancer Maternal Aunt    Breast cancer Maternal Aunt    No Known Allergies    Patient Care Team: Lethia Donlon L, PA-C as PCP - General (Family Medicine)   Outpatient Medications Prior to Visit  Medication Sig   albuterol  (VENTOLIN  HFA) 108 (90 Base) MCG/ACT inhaler Inhale 2 puffs into the lungs every 6  (six) hours as needed.   aspirin 81 MG tablet Take 81 mg by mouth daily.   atorvastatin  (LIPITOR) 10 MG tablet TAKE 1 TABLET BY MOUTH EVERY DAY   calcium  carbonate (OS-CAL) 600 MG TABS Take 600 mg by mouth daily.   CINNAMON PO Take 2,000 mg by mouth daily.   Collagen Hydrolysate, Bovine, POWD by Does not apply route.   fish oil-omega-3 fatty acids 1000 MG capsule Take 2 g by mouth daily.   fluticasone  (FLONASE ) 50 MCG/ACT nasal spray SPRAY 2 SPRAYS INTO EACH NOSTRIL EVERY DAY   sodium chloride (OCEAN) 0.65 % SOLN nasal spray Place 1 spray into both nostrils as needed.   [DISCONTINUED] umeclidinium-vilanterol (ANORO ELLIPTA ) 62.5-25 MCG/ACT AEPB INHALE 1 PUFF INTO THE LUNGS DAILY AT 6 (SIX) AM.   No facility-administered medications prior to visit.    Review of Systems  All other systems reviewed and are negative.         Objective:     BP 125/73 (BP Location: Right Arm, Patient Position: Sitting, Cuff Size: Normal)   Pulse 78   Temp 98.1 F (36.7 C) (Oral)   Resp (!) 98   Ht 5' 1 (1.549 m)   Wt 113 lb (51.3 kg)   BMI 21.35 kg/m  BP Readings from Last 3 Encounters:  05/21/24 125/73  12/11/23 ROLLEN)  119/57  11/07/23 121/73   Wt Readings from Last 3 Encounters:  05/21/24 113 lb (51.3 kg)  12/11/23 113 lb (51.3 kg)  11/07/23 110 lb (49.9 kg)      Physical Exam  BP 125/73 (BP Location: Right Arm, Patient Position: Sitting, Cuff Size: Normal)   Pulse 78   Temp 98.1 F (36.7 C) (Oral)   Resp (!) 98   Ht 5' 1 (1.549 m)   Wt 113 lb (51.3 kg)   BMI 21.35 kg/m   General Appearance:    Alert, cooperative, no distress, appears stated age  Head:    Normocephalic, without obvious abnormality, atraumatic  Eyes:    PERRL, conjunctiva/corneas clear, EOM's intact, fundi    benign, both eyes  Ears:    Normal TM's and external ear canals, both ears  Nose:   Nares normal, septum midline, mucosa normal, no drainage    or sinus tenderness  Throat:   Lips, mucosa, and tongue  normal; teeth and gums normal  Neck:   Supple, symmetrical, trachea midline, no adenopathy;    thyroid :  no enlargement/tenderness/nodules; no carotid   bruit or JVD  Back:     Symmetric, no curvature, ROM normal, no CVA tenderness  Lungs:     Clear to auscultation bilaterally, respirations unlabored  Chest Wall:    No tenderness or deformity   Heart:    Regular rate and rhythm, S1 and S2 normal, no murmur, rub   or gallop     Abdomen:     Soft, non-tender, bowel sounds active all four quadrants,    no masses, no organomegaly        Extremities:   Extremities normal, atraumatic, no cyanosis or edema  Pulses:   2+ and symmetric all extremities  Skin:   Skin color, texture, turgor normal, no rashes or lesions  Lymph nodes:   Cervical, supraclavicular, and axillary nodes normal  Neurologic:   CNII-XII intact, normal strength, sensation and reflexes    throughout      Assessment & Plan:    Routine Health Maintenance and Physical Exam  Immunization History  Administered Date(s) Administered   Fluad Quad(high Dose 65+) 06/15/2019   INFLUENZA, HIGH DOSE SEASONAL PF 05/24/2020, 05/19/2023   Influenza,inj,Quad PF,6+ Mos 05/06/2015, 07/04/2016, 07/12/2017, 07/24/2018   Influenza-Unspecified 06/24/2022   Moderna SARS-COV2 Booster Vaccination 06/20/2022   PFIZER Comirnaty(Gray Top)Covid-19 Tri-Sucrose Vaccine 06/20/2022   PFIZER(Purple Top)SARS-COV-2 Vaccination 11/01/2019, 11/27/2019, 06/19/2020   PNEUMOCOCCAL CONJUGATE-20 05/28/2022   Pfizer(Comirnaty)Fall Seasonal Vaccine 12 years and older 05/19/2023   Pneumococcal Conjugate-13 07/04/2016   Pneumococcal Polysaccharide-23 07/12/2017   Tdap 11/27/2013   Zoster Recombinant(Shingrix) 11/06/2020, 01/15/2021   Zoster, Live 04/18/2013    Health Maintenance  Topic Date Due   Medicare Annual Wellness (AWV)  04/07/2022   COVID-19 Vaccine (6 - Pfizer risk 2024-25 season) 06/06/2024 (Originally 05/06/2024)   Influenza Vaccine  12/03/2024  (Originally 04/05/2024)   DTaP/Tdap/Td (2 - Td or Tdap) 12/10/2024 (Originally 11/28/2023)   Lung Cancer Screening  07/04/2024   Mammogram  03/07/2025   Colonoscopy  09/08/2025   Pneumococcal Vaccine: 50+ Years  Completed   DEXA SCAN  Completed   Hepatitis C Screening  Completed   Zoster Vaccines- Shingrix  Completed   HPV VACCINES  Aged Out   Meningococcal B Vaccine  Aged Out    Discussed health benefits of physical activity, and encouraged her to engage in regular exercise appropriate for her age and condition. Leslie Oconnell was seen today for annual exam.  Diagnoses  and all orders for this visit:  Routine physical examination -     CMP14+EGFR -     Lipid panel -     TSH  Tobacco dependence -     nicotine  (NICODERM CQ ) 21 mg/24hr patch; Place 1 patch (21 mg total) onto the skin daily.  Mixed hyperlipidemia -     Lipid panel  Aortic atherosclerosis (HCC) -     Lipid panel  Centrilobular emphysema (HCC) -     umeclidinium-vilanterol (ANORO ELLIPTA ) 62.5-25 MCG/ACT AEPB; Inhale 1 puff into the lungs daily at 6 (six) AM.  Screening for diabetes mellitus -     CMP14+EGFR   .SABRA Discussed 150 minutes of exercise a week.  Encouraged vitamin D 1000 units and Calcium  1300mg  or 4 servings of dairy a day.  No falls PHQ no concerns Fasting labs ordered today Mammogram UTD Pap not indicated Colonoscopy UTD Bone density UTD Declined covid  Will get flu shot at pharmacy Pneumonia vaccine UTD  Smoking cessation counseling given today Discussed patches vs zyban vs chantix She would like to start patches HO given on tips for cravings 21mg  patches given to wear daily for next month Can send 14mg  after 1-2 months Lung cancer screening UTD Follow up in 3 months  COPD Refilled anoro Pt has rescue inhaler but she does not use it  Return in about 3 months (around 08/20/2024) for smoking cessation.     Leslie Convey, PA-C

## 2024-05-22 NOTE — Progress Notes (Signed)
 Leslie Oconnell,   Cholesterol looks great.  Kidney, liver, glucose look good!  Thyroid  normal.   Labs look great.

## 2024-06-05 DIAGNOSIS — Z23 Encounter for immunization: Secondary | ICD-10-CM | POA: Diagnosis not present

## 2024-07-12 ENCOUNTER — Other Ambulatory Visit: Payer: Self-pay | Admitting: Physician Assistant

## 2024-07-12 DIAGNOSIS — F17200 Nicotine dependence, unspecified, uncomplicated: Secondary | ICD-10-CM

## 2024-07-15 ENCOUNTER — Encounter: Payer: Self-pay | Admitting: Physician Assistant

## 2024-07-15 ENCOUNTER — Ambulatory Visit (INDEPENDENT_AMBULATORY_CARE_PROVIDER_SITE_OTHER)

## 2024-07-15 DIAGNOSIS — Z87891 Personal history of nicotine dependence: Secondary | ICD-10-CM

## 2024-07-15 DIAGNOSIS — F17200 Nicotine dependence, unspecified, uncomplicated: Secondary | ICD-10-CM

## 2024-07-15 DIAGNOSIS — F17210 Nicotine dependence, cigarettes, uncomplicated: Secondary | ICD-10-CM | POA: Diagnosis not present

## 2024-07-15 DIAGNOSIS — Z122 Encounter for screening for malignant neoplasm of respiratory organs: Secondary | ICD-10-CM

## 2024-07-17 MED ORDER — NICOTINE 21 MG/24HR TD PT24: 21.0000 mg | MEDICATED_PATCH | Freq: Every day | TRANSDERMAL | 1 refills | Status: DC

## 2024-07-19 ENCOUNTER — Other Ambulatory Visit: Payer: Self-pay

## 2024-07-19 DIAGNOSIS — Z87891 Personal history of nicotine dependence: Secondary | ICD-10-CM

## 2024-07-19 DIAGNOSIS — Z122 Encounter for screening for malignant neoplasm of respiratory organs: Secondary | ICD-10-CM

## 2024-07-19 DIAGNOSIS — F17210 Nicotine dependence, cigarettes, uncomplicated: Secondary | ICD-10-CM

## 2024-08-01 ENCOUNTER — Other Ambulatory Visit: Payer: Self-pay | Admitting: Physician Assistant

## 2024-08-01 DIAGNOSIS — J432 Centrilobular emphysema: Secondary | ICD-10-CM

## 2024-08-05 ENCOUNTER — Other Ambulatory Visit: Payer: Self-pay

## 2024-08-05 ENCOUNTER — Encounter: Payer: Self-pay | Admitting: Physician Assistant

## 2024-08-05 DIAGNOSIS — J432 Centrilobular emphysema: Secondary | ICD-10-CM

## 2024-08-05 MED ORDER — UMECLIDINIUM-VILANTEROL 62.5-25 MCG/ACT IN AEPB: 1.0000 | INHALATION_SPRAY | Freq: Every day | RESPIRATORY_TRACT | 5 refills | Status: AC

## 2024-08-20 ENCOUNTER — Ambulatory Visit: Admitting: Physician Assistant

## 2024-08-20 ENCOUNTER — Encounter: Payer: Self-pay | Admitting: Physician Assistant

## 2024-08-20 ENCOUNTER — Other Ambulatory Visit: Payer: Self-pay | Admitting: Physician Assistant

## 2024-08-20 VITALS — BP 126/60 | HR 83 | Ht 61.0 in | Wt 113.0 lb

## 2024-08-20 DIAGNOSIS — F17200 Nicotine dependence, unspecified, uncomplicated: Secondary | ICD-10-CM

## 2024-08-20 DIAGNOSIS — Z716 Tobacco abuse counseling: Secondary | ICD-10-CM | POA: Diagnosis not present

## 2024-08-20 DIAGNOSIS — I700 Atherosclerosis of aorta: Secondary | ICD-10-CM | POA: Diagnosis not present

## 2024-08-20 DIAGNOSIS — J432 Centrilobular emphysema: Secondary | ICD-10-CM | POA: Diagnosis not present

## 2024-08-20 DIAGNOSIS — H1013 Acute atopic conjunctivitis, bilateral: Secondary | ICD-10-CM | POA: Diagnosis not present

## 2024-08-20 DIAGNOSIS — J441 Chronic obstructive pulmonary disease with (acute) exacerbation: Secondary | ICD-10-CM

## 2024-08-20 MED ORDER — PREDNISONE 20 MG PO TABS: 20.0000 mg | ORAL_TABLET | Freq: Two times a day (BID) | ORAL | 0 refills | Status: AC

## 2024-08-20 MED ORDER — NICOTINE 14 MG/24HR TD PT24: 14.0000 mg | MEDICATED_PATCH | Freq: Every day | TRANSDERMAL | 3 refills | Status: AC

## 2024-08-20 MED ORDER — AZITHROMYCIN 250 MG PO TABS: ORAL_TABLET | ORAL | 0 refills | Status: AC

## 2024-08-20 NOTE — Progress Notes (Unsigned)
° °  Established Patient Office Visit  Subjective   Patient ID: Leslie Oconnell, female    DOB: 1951-01-18  Age: 73 y.o. MRN: [id number]  Chief Complaint  Patient presents with   Medical Management of Chronic Issues    HPI  {History (Optional):23778}  ROS    Objective:     BP (!) 153/55   Pulse 83   Ht 5' 1 (1.549 m)   Wt 113 lb (51.3 kg)   SpO2 100%   BMI 21.35 kg/m  {Vitals History (Optional):23777}  Physical Exam   No results found for any visits on 08/20/24.  {Labs (Optional):23779}  The 10-year ASCVD risk score (Arnett DK, et al., 2019) is: 27.2%    Assessment & Plan:   Problem List Items Addressed This Visit       Unprioritized   Current smoker   Aortic atherosclerosis   COPD exacerbation (HCC)   Relevant Medications   azithromycin  (ZITHROMAX  Z-PAK) 250 MG tablet   predniSONE  (DELTASONE ) 20 MG tablet   nicotine  (NICODERM CQ ) 14 mg/24hr patch (Start on 09/19/2024)   Tobacco dependence - Primary   Relevant Medications   nicotine  (NICODERM CQ ) 14 mg/24hr patch (Start on 09/19/2024)   Other Visit Diagnoses       Centrilobular emphysema (HCC)       Relevant Medications   azithromycin  (ZITHROMAX  Z-PAK) 250 MG tablet   predniSONE  (DELTASONE ) 20 MG tablet   nicotine  (NICODERM CQ ) 14 mg/24hr patch (Start on 09/19/2024)       Return in about 3 months (around 11/18/2024) for smoking cessation.    Yoon Barca, PA-C

## 2024-08-20 NOTE — Patient Instructions (Signed)
 Chronic Obstructive Pulmonary Disease Exacerbation  Chronic obstructive pulmonary disease (COPD) is a long-term (chronic) lung problem. When you have COPD, it can feel harder to breathe in or out. COPD exacerbation is a flare-up of symptoms when breathing gets worse and more treatment may be needed. Without treatment, flare-ups can be life-threatening. If they happen often, your lungs can become more damaged. What are the causes? Not taking your usual COPD medicines as told by your health care provider. A cold or the flu, which can cause infection in your lungs. Being exposed to things that make your breathing worse, such as: Smoke. Air pollution. Fumes. Dust. Allergies. Weather changes. What are the signs or symptoms? Symptoms do not get better or get worse even if you take your medicines as told by your provider. Symptoms may include: More shortness of breath. You may only be able to speak one or two words at a time. More coughing or mucus from your lungs. More wheezing or chest tightness. Being more tired and having less energy. Confusion. How is this diagnosed? This condition is diagnosed based on: Symptoms that get worse. Your medical history. A physical exam. You may also have tests, including: A chest X-ray. Blood or mucus tests. How is this treated? You may be able to stay home or you may need to go to the hospital. Treatment may include: Taking medicines. These may include: Inhalers. These have medicines in them that you breathe in. These may be more of what you already take or they may be new. Steroids. These reduce inflammation in the airways. These may be inhaled, taken by mouth, or given in an IV. Antibiotics. These treat infection. Using oxygen. Using a device to help you clear mucus. Follow these instructions at home: Medicines Take your medicines only as told by your provider. If you were given antibiotics or steroids, take them as told by your provider. Do  not stop taking them even if you start to feel better. Lifestyle Several times a day, wash your hands with soap and water for at least 20 seconds. If you cannot use soap and water, use hand sanitizer. This may help keep you from getting an infection. Avoid being around crowds or people who are sick. Do not smoke or use any products that contain nicotine or tobacco. If you need help quitting, ask your provider. Return to your normal activities when your provider says that it's safe. Use breathing methods to control your stress and catch your breath. How is this prevented? Follow your COPD action plan. The action plan tells you what to do if you're feeling good and what to do when you start feeling worse. Discuss the plan often with your provider. Make sure you get all the shots, also called vaccines, that your provider recommends. Ask your provider about a flu shot and a pneumonia shot. Use oxygen therapy if told by your provider. If you need home oxygen therapy, ask your provider how often to check your oxygen level with a device called an oximeter. Keep all follow-up visits to review your COPD action plan. Your provider will want to check on your condition often to keep you healthy and out of the hospital. Contact a health care provider if: Your COPD symptoms get worse. You have a fever or chills. You have trouble doing daily activities. You have trouble breathing even when you are resting. Get help right away if: You are short of breath and cannot: Talk in full sentences. Do normal activities. You have chest  pain. You feel confused. These symptoms may be an emergency. Call 911 right away. Do not wait to see if the symptoms will go away. Do not drive yourself to the hospital. This information is not intended to replace advice given to you by your health care provider. Make sure you discuss any questions you have with your health care provider. Document Revised: 05/25/2023 Document  Reviewed: 11/07/2022 Elsevier Patient Education  2024 [employer].

## 2024-08-21 NOTE — Telephone Encounter (Signed)
 Please advise, thanks.   DIAGNOSIS REQUIRED. DIAGNOSIS CODE NOT PROVIDED, IS INVALID, OR NOT COVERED FOR PRESCRIBED DRUG. PROVIDE/CLARIFY PATIENT DIAGNOSIS, PROVIDE ALTERNATE THERAPY, OR INITIATE PRIOR AUTH

## 2024-08-21 NOTE — Telephone Encounter (Signed)
COPD exacerbation

## 2024-11-18 ENCOUNTER — Ambulatory Visit: Admitting: Physician Assistant
